# Patient Record
Sex: Female | Born: 1965
Health system: Southern US, Community
[De-identification: ages and names within clinical notes are randomized; demographics above are authoritative.]

## PROBLEM LIST (undated history)

## (undated) DIAGNOSIS — D649 Anemia, unspecified: Secondary | ICD-10-CM

## (undated) DIAGNOSIS — Z8709 Personal history of other diseases of the respiratory system: Secondary | ICD-10-CM

## (undated) DIAGNOSIS — N920 Excessive and frequent menstruation with regular cycle: Secondary | ICD-10-CM

## (undated) DIAGNOSIS — Z87440 Personal history of urinary (tract) infections: Secondary | ICD-10-CM

## (undated) HISTORY — DX: Personal history of other diseases of the respiratory system: Z87.09

## (undated) HISTORY — PX: LUMBAR LAMINECTOMY: SHX95

## (undated) HISTORY — DX: Anemia, unspecified: D64.9

## (undated) HISTORY — DX: Excessive and frequent menstruation with regular cycle: N92.0

## (undated) HISTORY — DX: Personal history of urinary (tract) infections: Z87.440

---

## 2000-09-08 ENCOUNTER — Other Ambulatory Visit: Admission: RE | Admit: 2000-09-08 | Discharge: 2000-09-08 | Payer: Self-pay | Admitting: Gynecology

## 2003-06-14 ENCOUNTER — Encounter: Payer: Self-pay | Admitting: Gynecology

## 2003-06-14 ENCOUNTER — Encounter: Admission: RE | Admit: 2003-06-14 | Discharge: 2003-06-14 | Payer: Self-pay | Admitting: Gynecology

## 2005-02-05 ENCOUNTER — Ambulatory Visit: Payer: Self-pay | Admitting: Family Medicine

## 2005-03-25 ENCOUNTER — Ambulatory Visit: Payer: Self-pay | Admitting: Family Medicine

## 2005-04-09 ENCOUNTER — Ambulatory Visit: Payer: Self-pay | Admitting: Family Medicine

## 2005-04-09 ENCOUNTER — Other Ambulatory Visit: Admission: RE | Admit: 2005-04-09 | Discharge: 2005-04-09 | Payer: Self-pay | Admitting: Family Medicine

## 2005-06-13 ENCOUNTER — Encounter: Admission: RE | Admit: 2005-06-13 | Discharge: 2005-06-13 | Payer: Self-pay | Admitting: Family Medicine

## 2005-06-28 ENCOUNTER — Ambulatory Visit: Payer: Self-pay | Admitting: Family Medicine

## 2006-01-22 ENCOUNTER — Ambulatory Visit: Payer: Self-pay | Admitting: Family Medicine

## 2006-07-25 ENCOUNTER — Encounter: Admission: RE | Admit: 2006-07-25 | Discharge: 2006-07-25 | Payer: Self-pay | Admitting: Gynecology

## 2006-07-29 ENCOUNTER — Ambulatory Visit: Payer: Self-pay | Admitting: Family Medicine

## 2007-07-20 ENCOUNTER — Telehealth (INDEPENDENT_AMBULATORY_CARE_PROVIDER_SITE_OTHER): Payer: Self-pay | Admitting: *Deleted

## 2007-07-21 ENCOUNTER — Ambulatory Visit: Payer: Self-pay | Admitting: Family Medicine

## 2007-07-21 DIAGNOSIS — H40009 Preglaucoma, unspecified, unspecified eye: Secondary | ICD-10-CM | POA: Insufficient documentation

## 2007-07-21 DIAGNOSIS — K2970 Gastritis, unspecified, without bleeding: Secondary | ICD-10-CM | POA: Insufficient documentation

## 2007-07-21 DIAGNOSIS — J309 Allergic rhinitis, unspecified: Secondary | ICD-10-CM | POA: Insufficient documentation

## 2007-07-21 DIAGNOSIS — K2990 Gastroduodenitis, unspecified, without bleeding: Secondary | ICD-10-CM

## 2007-07-31 ENCOUNTER — Encounter: Admission: RE | Admit: 2007-07-31 | Discharge: 2007-07-31 | Payer: Self-pay | Admitting: Gynecology

## 2007-07-31 ENCOUNTER — Encounter: Payer: Self-pay | Admitting: Family Medicine

## 2007-08-07 ENCOUNTER — Encounter: Payer: Self-pay | Admitting: Family Medicine

## 2007-08-07 ENCOUNTER — Encounter: Admission: RE | Admit: 2007-08-07 | Discharge: 2007-08-07 | Payer: Self-pay | Admitting: Gynecology

## 2007-10-05 ENCOUNTER — Ambulatory Visit: Payer: Self-pay | Admitting: Family Medicine

## 2007-10-05 DIAGNOSIS — R55 Syncope and collapse: Secondary | ICD-10-CM | POA: Insufficient documentation

## 2007-10-05 DIAGNOSIS — R109 Unspecified abdominal pain: Secondary | ICD-10-CM | POA: Insufficient documentation

## 2007-10-05 LAB — CONVERTED CEMR LAB
Bacteria, UA: 0
Bilirubin Urine: NEGATIVE
Casts: 0 /lpf
Protein, U semiquant: NEGATIVE
Urine crystals, microscopic: 1 /hpf

## 2008-01-13 ENCOUNTER — Encounter (INDEPENDENT_AMBULATORY_CARE_PROVIDER_SITE_OTHER): Payer: Self-pay | Admitting: *Deleted

## 2008-02-12 ENCOUNTER — Encounter: Admission: RE | Admit: 2008-02-12 | Discharge: 2008-02-12 | Payer: Self-pay | Admitting: Gynecology

## 2008-04-06 ENCOUNTER — Telehealth: Payer: Self-pay | Admitting: Family Medicine

## 2008-08-05 ENCOUNTER — Encounter: Admission: RE | Admit: 2008-08-05 | Discharge: 2008-08-05 | Payer: Self-pay | Admitting: Gynecology

## 2009-01-25 ENCOUNTER — Telehealth (INDEPENDENT_AMBULATORY_CARE_PROVIDER_SITE_OTHER): Payer: Self-pay | Admitting: *Deleted

## 2009-08-08 ENCOUNTER — Ambulatory Visit: Payer: Self-pay | Admitting: Family Medicine

## 2009-08-08 DIAGNOSIS — N390 Urinary tract infection, site not specified: Secondary | ICD-10-CM | POA: Insufficient documentation

## 2009-08-08 DIAGNOSIS — R233 Spontaneous ecchymoses: Secondary | ICD-10-CM | POA: Insufficient documentation

## 2009-08-08 DIAGNOSIS — D509 Iron deficiency anemia, unspecified: Secondary | ICD-10-CM | POA: Insufficient documentation

## 2009-08-08 LAB — CONVERTED CEMR LAB
Nitrite: POSITIVE
Urobilinogen, UA: 0.2

## 2009-08-09 LAB — CONVERTED CEMR LAB
Basophils Relative: 0.5 % (ref 0.0–3.0)
Eosinophils Absolute: 0.1 10*3/uL (ref 0.0–0.7)
INR: 1.1 — ABNORMAL HIGH (ref 0.8–1.0)
MCHC: 33.5 g/dL (ref 30.0–36.0)
MCV: 92.3 fL (ref 78.0–100.0)
Monocytes Absolute: 0.5 10*3/uL (ref 0.1–1.0)
Neutrophils Relative %: 64.8 % (ref 43.0–77.0)
RBC: 4.07 M/uL (ref 3.87–5.11)
RDW: 12.3 % (ref 11.5–14.6)

## 2009-08-11 ENCOUNTER — Encounter: Admission: RE | Admit: 2009-08-11 | Discharge: 2009-08-11 | Payer: Self-pay | Admitting: Gynecology

## 2010-08-13 ENCOUNTER — Encounter: Admission: RE | Admit: 2010-08-13 | Discharge: 2010-08-13 | Payer: Self-pay | Admitting: Gynecology

## 2010-11-11 ENCOUNTER — Encounter: Payer: Self-pay | Admitting: Family Medicine

## 2011-07-08 ENCOUNTER — Other Ambulatory Visit: Payer: Self-pay | Admitting: Obstetrics and Gynecology

## 2011-07-08 DIAGNOSIS — Z1231 Encounter for screening mammogram for malignant neoplasm of breast: Secondary | ICD-10-CM

## 2011-08-16 ENCOUNTER — Ambulatory Visit
Admission: RE | Admit: 2011-08-16 | Discharge: 2011-08-16 | Disposition: A | Discharge status: Home / Self Care | Payer: Self-pay | Source: Ambulatory Visit | Attending: Obstetrics and Gynecology | Admitting: Obstetrics and Gynecology

## 2011-08-16 DIAGNOSIS — Z1231 Encounter for screening mammogram for malignant neoplasm of breast: Secondary | ICD-10-CM

## 2012-07-07 ENCOUNTER — Other Ambulatory Visit: Payer: Self-pay | Admitting: Obstetrics and Gynecology

## 2012-07-07 DIAGNOSIS — Z1231 Encounter for screening mammogram for malignant neoplasm of breast: Secondary | ICD-10-CM

## 2012-07-31 ENCOUNTER — Encounter: Payer: Self-pay | Admitting: Family Medicine

## 2012-07-31 ENCOUNTER — Ambulatory Visit (INDEPENDENT_AMBULATORY_CARE_PROVIDER_SITE_OTHER): Payer: BC Managed Care – PPO | Admitting: Family Medicine

## 2012-07-31 VITALS — BP 124/72 | HR 68 | Temp 98.4°F | Ht 68.0 in | Wt 138.5 lb

## 2012-07-31 DIAGNOSIS — G43009 Migraine without aura, not intractable, without status migrainosus: Secondary | ICD-10-CM | POA: Insufficient documentation

## 2012-07-31 DIAGNOSIS — J358 Other chronic diseases of tonsils and adenoids: Secondary | ICD-10-CM | POA: Insufficient documentation

## 2012-07-31 DIAGNOSIS — J069 Acute upper respiratory infection, unspecified: Secondary | ICD-10-CM | POA: Insufficient documentation

## 2012-07-31 NOTE — Assessment & Plan Note (Signed)
Disc salt water gargles and use of long q tip  Nl exam today  Will update

## 2012-07-31 NOTE — Patient Instructions (Addendum)
You can gargle and try to remove tonsillar debris with a q tip  For cold symtoms - zyrtec, fluids/mucinex as needed For migraine - try aleve 1-2 pills with food twice daily  Drink enough water Gradually cut caff to none  Keep same sleep and wake times  Follow up if no improvement

## 2012-07-31 NOTE — Assessment & Plan Note (Signed)
Multifactorial in terms of triggers Long discussion about migraine and lifesyle change- rec made- see AVS nsaids- longer acting if needed  Disc poss of rebound F/u if not imp-may need prophylaxis

## 2012-07-31 NOTE — Progress Notes (Signed)
Subjective:    Patient ID: Alice Page, female    DOB: 1966/01/21, 46 y.o.   MRN: [id number]  HPI Here for throat - and headaches  Started achiness and st this week  Feels congestion  Coughs up a bit of yellow phlegm No fever today  Also some debris in throat- with odor  Can see white thing in her throat   No severe throat pain or fever - just aches  No meds except advil  HEADACHE Had them all her life-they are increasing Usually excedrin will take them away  Will get them for 3-4 days  One sided, throbbing and worse with exertion  Mild light / sound sens Sometimes nauseated  Now gets headaches more (used to be around menses), now every 1-2 weeks  1 cup of coffee am and afternoon  No regular exercise  Sleeps well   Patient Active Problem List  Diagnosis  . ANEMIA, IRON DEFICIENCY  . INCREASED INTRAOCULAR PRESSURE  . ALLERGIC RHINITIS  . GASTRITIS  . UTI  . SYNCOPE  . ECCHYMOSES, SPONTANEOUS  . FLANK PAIN, RIGHT   No past medical history on file. No past surgical history on file. History  Substance Use Topics  . Smoking status: Never Smoker   . Smokeless tobacco: Not on file  . Alcohol Use: No   No family history on file. Allergies  Allergen Reactions  . Cetirizine Hcl     REACTION: sedation  . Ciprofloxacin     REACTION: joint pain   No current outpatient prescriptions on file prior to visit.       Review of Systems Review of Systems  Constitutional: Negative for fever, appetite change, fatigue and unexpected weight change.  ENT pos for scratchy throat/ congestion/ post nasal drip  Eyes: Negative for pain and visual disturbance.  Respiratory: Negative for sob or wheeze Cardiovascular: Negative for cp or palpitations    Gastrointestinal: Negative for nausea, diarrhea and constipation.  Genitourinary: Negative for urgency and frequency.  Skin: Negative for pallor or rash   Neurological: Negative for weakness, light-headedness, numbness and  headaches.  Hematological: Negative for adenopathy. Does not bruise/bleed easily.  Psychiatric/Behavioral: Negative for dysphoric mood. The patient is not nervous/anxious.         Objective:   Physical Exam  Constitutional: She appears well-developed and well-nourished. No distress.  HENT:  Head: Normocephalic and atraumatic.  Right Ear: External ear normal.  Left Ear: External ear normal.  Mouth/Throat: Oropharynx is clear and moist.       Nares are injected and congested   Throat- with post nasal drip / scant tonsillar debris -no redness or lesions No facial tenderness  Eyes: Conjunctivae normal and EOM are normal. Pupils are equal, round, and reactive to light. Right eye exhibits no discharge. Left eye exhibits no discharge.  Neck: Normal range of motion. Neck supple. No JVD present. No thyromegaly present.  Cardiovascular: Normal rate and regular rhythm.   Pulmonary/Chest: Effort normal and breath sounds normal. No respiratory distress. She has no wheezes.  Abdominal: Soft. Bowel sounds are normal. She exhibits no distension and no mass. There is no tenderness.  Musculoskeletal: She exhibits no edema.  Lymphadenopathy:    She has no cervical adenopathy.  Neurological: She is alert. She has normal reflexes. She displays no atrophy and no tremor. No cranial nerve deficit or sensory deficit. She exhibits normal muscle tone. Coordination and gait normal.       No focal cerebellar signs   Skin: Skin is warm  and dry. No rash noted. No erythema. No pallor.  Psychiatric: She has a normal mood and affect.          Assessment & Plan:

## 2012-07-31 NOTE — Assessment & Plan Note (Signed)
Viral, uncomplicated and reasuring exam Disc symptomatic care - see instructions on AVS  Update if not starting to improve in a week or if worsening

## 2012-08-17 ENCOUNTER — Ambulatory Visit
Admission: RE | Admit: 2012-08-17 | Discharge: 2012-08-17 | Disposition: A | Discharge status: Home / Self Care | Payer: BC Managed Care – PPO | Source: Ambulatory Visit | Attending: Obstetrics and Gynecology | Admitting: Obstetrics and Gynecology

## 2012-08-17 DIAGNOSIS — Z1231 Encounter for screening mammogram for malignant neoplasm of breast: Secondary | ICD-10-CM

## 2013-07-14 ENCOUNTER — Other Ambulatory Visit: Payer: Self-pay

## 2013-07-14 DIAGNOSIS — Z1231 Encounter for screening mammogram for malignant neoplasm of breast: Secondary | ICD-10-CM

## 2013-08-19 ENCOUNTER — Ambulatory Visit: Payer: BC Managed Care – PPO

## 2013-09-07 ENCOUNTER — Encounter: Payer: Self-pay | Admitting: Family Medicine

## 2013-09-07 ENCOUNTER — Ambulatory Visit (INDEPENDENT_AMBULATORY_CARE_PROVIDER_SITE_OTHER): Payer: BC Managed Care – PPO | Admitting: Family Medicine

## 2013-09-07 VITALS — BP 120/88 | HR 68 | Temp 97.6°F | Ht 68.0 in | Wt 141.0 lb

## 2013-09-07 DIAGNOSIS — R300 Dysuria: Secondary | ICD-10-CM

## 2013-09-07 DIAGNOSIS — N390 Urinary tract infection, site not specified: Secondary | ICD-10-CM

## 2013-09-07 LAB — POCT UA - MICROSCOPIC ONLY

## 2013-09-07 LAB — POCT URINALYSIS DIPSTICK
Bilirubin, UA: NEGATIVE
Glucose, UA: NEGATIVE
Nitrite, UA: POSITIVE
Spec Grav, UA: 1.01
Urobilinogen, UA: 0.2

## 2013-09-07 MED ORDER — NITROFURANTOIN MONOHYD MACRO 100 MG PO CAPS: 100.0000 mg | ORAL_CAPSULE | Freq: Two times a day (BID) | ORAL | Status: DC

## 2013-09-07 NOTE — Addendum Note (Signed)
Addended by: Damita Lack on: 09/07/2013 03:51 PM   Modules accepted: Orders

## 2013-09-07 NOTE — Patient Instructions (Signed)
Start and complete antibiotics. Push fluids. Given hematuria.. Return in 2 weeks for UA only to make sure blood is gone completely.

## 2013-09-07 NOTE — Progress Notes (Signed)
  Subjective:    Patient ID: Alice Page, female    DOB: 1966-01-07, 47 y.o.   MRN: [id number]  Urinary Tract Infection  This is a new problem. The current episode started yesterday. The problem has been gradually worsening. The quality of the pain is described as burning. The pain is moderate. There has been no fever. She is sexually active. There is no history of pyelonephritis. Associated symptoms include frequency and hematuria. Pertinent negatives include no chills, flank pain, hesitancy, nausea, urgency or vomiting. She has tried increased fluids for the symptoms. The treatment provided no relief. Her past medical history is significant for recurrent UTIs. There is no history of catheterization, kidney stones, a single kidney, urinary stasis or a urological procedure. Hx of UTI      Review of Systems  Constitutional: Negative for chills.  Gastrointestinal: Negative for nausea and vomiting.  Genitourinary: Positive for frequency and hematuria. Negative for hesitancy, urgency and flank pain.       Objective:   Physical Exam  Constitutional: Vital signs are normal. She appears well-developed and well-nourished. She is cooperative.  Non-toxic appearance. She does not appear ill. No distress.  HENT:  Head: Normocephalic.  Right Ear: Hearing, tympanic membrane, external ear and ear canal normal. Tympanic membrane is not erythematous, not retracted and not bulging.  Left Ear: Hearing, tympanic membrane, external ear and ear canal normal. Tympanic membrane is not erythematous, not retracted and not bulging.  Nose: No mucosal edema or rhinorrhea. Right sinus exhibits no maxillary sinus tenderness and no frontal sinus tenderness. Left sinus exhibits no maxillary sinus tenderness and no frontal sinus tenderness.  Mouth/Throat: Uvula is midline, oropharynx is clear and moist and mucous membranes are normal.  Eyes: Conjunctivae, EOM and lids are normal. Pupils are equal, round, and reactive to  light. Lids are everted and swept, no foreign bodies found.  Neck: Trachea normal and normal range of motion. Neck supple. Carotid bruit is not present. No mass and no thyromegaly present.  Cardiovascular: Normal rate, regular rhythm, S1 normal, S2 normal, normal heart sounds, intact distal pulses and normal pulses.  Exam reveals no gallop and no friction rub.   No murmur heard. Pulmonary/Chest: Effort normal and breath sounds normal. Not tachypneic. No respiratory distress. She has no decreased breath sounds. She has no wheezes. She has no rhonchi. She has no rales.  Abdominal: Soft. Normal appearance and bowel sounds are normal. There is no tenderness. There is no CVA tenderness.  Neurological: She is alert.  Skin: Skin is warm, dry and intact. No rash noted.  Psychiatric: Her speech is normal and behavior is normal. Judgment and thought content normal. Her mood appears not anxious. Cognition and memory are normal. She does not exhibit a depressed mood.          Assessment & Plan:

## 2013-09-07 NOTE — Assessment & Plan Note (Signed)
Simple uncomplicated.. macrobid BID x 7 days.

## 2013-09-07 NOTE — Progress Notes (Signed)
Pre-visit discussion using our clinic review tool. No additional management support is needed unless otherwise documented below in the visit note.  

## 2013-09-10 ENCOUNTER — Ambulatory Visit
Admission: RE | Admit: 2013-09-10 | Discharge: 2013-09-10 | Disposition: A | Discharge status: Home / Self Care | Payer: BC Managed Care – PPO | Source: Ambulatory Visit

## 2013-09-10 DIAGNOSIS — Z1231 Encounter for screening mammogram for malignant neoplasm of breast: Secondary | ICD-10-CM

## 2013-09-14 ENCOUNTER — Telehealth: Payer: Self-pay | Admitting: Family Medicine

## 2013-09-14 NOTE — Telephone Encounter (Signed)
Patient Information:  [name] Name: [name]  Phone: [phone]  Patient: Alice Page, Alice Page  Gender: Female  DOB: Jul 01, 1966  Age: 47 Years  PCP: Roxy Manns [facility])  Pregnant: No  Office Follow Up:  Does the office need to follow up with this patient?: Yes  Instructions For The Office: Request different antibiotic. Macrobid causing side effects  RN Note:  Pharmacy [address]  patient would like different antibiotic due to Macrobid side effects.  Please advise.  Symptoms  Reason For Call & Symptoms: Patient was seen in the office last week for UTI and saw Kerby Nora MD.  Placed on Macrobid .  She reports the Macrobid is making her feel bad- headaches, nausea and generally sick.'.  She stopped the medication Saturday and Sunday and felt better.  When she restarted the medication symptoms returned. The UTI is better.     Would like a different Antibiotic  Reviewed Health History In EMR: Yes  Reviewed Medications In EMR: Yes  Reviewed Allergies In EMR: Yes  Reviewed Surgeries / Procedures: Yes  Date of Onset of Symptoms: 09/07/2013  Treatments Tried: Macrobid  Treatments Tried Worked: Yes OB / GYN:  LMP: 08/24/2013  Guideline(s) Used:  Urination Pain - Female  No Protocol Available - Sick Adult  Disposition Per Guideline:   Discuss with PCP and Callback by Nurse Today  Reason For Disposition Reached:   Nursing judgment  Advice Given:  Call Back If:  New symptoms develop  You become worse.  RN Overrode Recommendation:  Patient Requests Prescription  Request different antibiotic. Macrobid causing side effects

## 2013-09-14 NOTE — Telephone Encounter (Signed)
Left message for patient to return my call.

## 2013-09-14 NOTE — Telephone Encounter (Signed)
Given she took for 4-5 days and symptoms have resolved as well as she has multiple antibiotic intolerances, I recommend staying off antibiotics for now. Call if symptoms returning.

## 2013-09-15 NOTE — Telephone Encounter (Signed)
Patient notified as instructed by telephone.  Alice Page states she took it yesterday with some food and milk and seemed to tolerate it much better. I advised that if she feels she can continue and finish the full course of antibiotics, to lets try but if they start making her sick again, she can stop and just call us if her symptoms return.  Patient in agreement with plan.

## 2014-01-26 ENCOUNTER — Telehealth: Payer: Self-pay | Admitting: Family Medicine

## 2014-01-26 NOTE — Telephone Encounter (Signed)
Patient Information:  [name] Name: Burnett HarryShelly  Phone: [phone]  Patient: Alice Page, Alice Page  Gender: Female  DOB: 05-16-1966  Age: 48 Years  [facility]  Pregnant: No  Office Follow Up:  Does the office need to follow up with this patient?: No  Instructions For The Office: N/A   Symptoms  Reason For Call & Symptoms: Started with burning with urination yesterday morning-01/25/14 and sx got worse by 0100-01/26/14. Having urgency, burning, chills, pink colored urine and trace blood with wiping. Increased fluids overnight and started AZO and old Rx -Nitrofurantoin Macro 100 mgs 1 tab at 0130. Last UTI was in 08/2013 and has 5 pills left. Afebrile this morning at 0900 and sx starting to improve. Requesting appointment for 01/27/14.   Reviewed Health History In EMR: Yes  Reviewed Medications In EMR: Yes  Reviewed Allergies In EMR: Yes  Reviewed Surgeries / Procedures: Yes  Date of Onset of Symptoms: 01/25/2014  Treatments Tried: Azo tabs, Nitrofurantoin  Treatments Tried Worked: No OB / GYN:  LMP: 01/12/2014  Guideline(s) Used:  Urination Pain - Female  Disposition Per Guideline:   See Today in Office  Reason For Disposition Reached:   Painful urination AND EITHER frequency or urgency  Advice Given:  Fluids:   Drink extra fluids. Drink 8-10 glasses of liquids a day (Reason: to produce a dilute, non-irritating urine).  Warm Saline SITZ Baths to Reduce Pain:  Sit in a warm saline bath for 20 minutes to cleanse the area and to reduce pain. Add 2 oz. of table salt or baking soda to a tub of water.  Call Back If:  You become worse.  Patient Will Follow Care Advice:Yes  Appointment Scheduled:  01/27/2014 10:30:00 Appointment Scheduled Provider:  Nicki ReaperBaity, [place]

## 2014-01-27 ENCOUNTER — Encounter: Payer: Self-pay | Admitting: Internal Medicine

## 2014-01-27 ENCOUNTER — Ambulatory Visit (INDEPENDENT_AMBULATORY_CARE_PROVIDER_SITE_OTHER): Payer: BC Managed Care – PPO | Admitting: Internal Medicine

## 2014-01-27 VITALS — BP 110/78 | HR 59 | Temp 98.0°F | Wt 140.2 lb

## 2014-01-27 DIAGNOSIS — R3915 Urgency of urination: Secondary | ICD-10-CM

## 2014-01-27 DIAGNOSIS — R300 Dysuria: Secondary | ICD-10-CM

## 2014-01-27 DIAGNOSIS — IMO0001 Reserved for inherently not codable concepts without codable children: Secondary | ICD-10-CM

## 2014-01-27 DIAGNOSIS — N390 Urinary tract infection, site not specified: Secondary | ICD-10-CM

## 2014-01-27 DIAGNOSIS — R350 Frequency of micturition: Secondary | ICD-10-CM

## 2014-01-27 LAB — POCT URINALYSIS DIPSTICK
Bilirubin, UA: NEGATIVE
Glucose, UA: NEGATIVE
KETONES UA: NEGATIVE
Leukocytes, UA: NEGATIVE
Nitrite, UA: NEGATIVE
PH UA: 6.5
PROTEIN UA: NEGATIVE
SPEC GRAV UA: 1.01
Urobilinogen, UA: 0.2

## 2014-01-27 MED ORDER — NITROFURANTOIN MONOHYD MACRO 100 MG PO CAPS: 100.0000 mg | ORAL_CAPSULE | Freq: Two times a day (BID) | ORAL | Status: DC

## 2014-01-27 NOTE — Patient Instructions (Addendum)
Urinary Tract Infection  Urinary tract infections (UTIs) can develop anywhere along your urinary tract. Your urinary tract is your body's drainage system for removing wastes and extra water. Your urinary tract includes two kidneys, two ureters, a bladder, and a urethra. Your kidneys are a pair of bean-shaped organs. Each kidney is about the size of your fist. They are located below your ribs, one on each side of your spine.  CAUSES  Infections are caused by microbes, which are microscopic organisms, including fungi, viruses, and bacteria. These organisms are so small that they can only be seen through a microscope. Bacteria are the microbes that most commonly cause UTIs.  SYMPTOMS   Symptoms of UTIs may vary by age and gender of the patient and by the location of the infection. Symptoms in young women typically include a frequent and intense urge to urinate and a painful, burning feeling in the bladder or urethra during urination. Older women and men are more likely to be tired, shaky, and weak and have muscle aches and abdominal pain. A fever may mean the infection is in your kidneys. Other symptoms of a kidney infection include pain in your back or sides below the ribs, nausea, and vomiting.  DIAGNOSIS  To diagnose a UTI, your caregiver will ask you about your symptoms. Your caregiver also will ask to provide a urine sample. The urine sample will be tested for bacteria and white blood cells. White blood cells are made by your body to help fight infection.  TREATMENT   Typically, UTIs can be treated with medication. Because most UTIs are caused by a bacterial infection, they usually can be treated with the use of antibiotics. The choice of antibiotic and length of treatment depend on your symptoms and the type of bacteria causing your infection.  HOME CARE INSTRUCTIONS   If you were prescribed antibiotics, take them exactly as your caregiver instructs you. Finish the medication even if you feel better after you  have only taken some of the medication.   Drink enough water and fluids to keep your urine clear or pale yellow.   Avoid caffeine, tea, and carbonated beverages. They tend to irritate your bladder.   Empty your bladder often. Avoid holding urine for long periods of time.   Empty your bladder before and after sexual intercourse.   After a bowel movement, women should cleanse from front to back. Use each tissue only once.  SEEK MEDICAL CARE IF:    You have back pain.   You develop a fever.   Your symptoms do not begin to resolve within 3 days.  SEEK IMMEDIATE MEDICAL CARE IF:    You have severe back pain or lower abdominal pain.   You develop chills.   You have nausea or vomiting.   You have continued burning or discomfort with urination.  MAKE SURE YOU:    Understand these instructions.   Will watch your condition.   Will get help right away if you are not doing well or get worse.  Document Released: 07/17/2005 Document Revised: 04/07/2012 Document Reviewed: 11/15/2011  [employer] Patient Information 2014 [employer].

## 2014-01-27 NOTE — Progress Notes (Signed)
Pre visit review using our clinic review tool, if applicable. No additional management support is needed unless otherwise documented below in the visit note. 

## 2014-01-27 NOTE — Addendum Note (Signed)
Addended by: Roena MaladyEVONTENNO, Hollis Oh Y on: 01/27/2014 01:40 PM   Modules accepted: Orders

## 2014-01-27 NOTE — Progress Notes (Signed)
HPI  Pt presents to the clinic today with c/o urgency, frequency, dysuria and blood in her urine. She reports this started 2 days ago. She did have some left over macrobid. She has taken this for 2 days. She has also taken Azo with some relief. She denies fever, chills, low back pain, nausea or vomitng.   Review of Systems  Past Medical History  Diagnosis Date  . History of allergic rhinitis   . Heavy menses   . Anemia   . History of recurrent UTIs     No family history on file.  History   Social History  . Marital Status: Married    Spouse Name: N/A    Number of Children: N/A  . Years of Education: N/A   Occupational History  . Not on file.   Social History Main Topics  . Smoking status: Never Smoker   . Smokeless tobacco: Never Used  . Alcohol Use: No  . Drug Use: No  . Sexual Activity: Not on file   Other Topics Concern  . Not on file   Social History Narrative  . No narrative on file    Allergies  Allergen Reactions  . Cetirizine Hcl     REACTION: sedation  . Ciprofloxacin     REACTION: joint pain    Constitutional: Denies fever, malaise, fatigue, headache or abrupt weight changes.   GU: Pt reports urgency, frequency and pain with urination. Denies burning sensation, odor or discharge. Skin: Denies redness, rashes, lesions or ulcercations.   No other specific complaints in a complete review of systems (except as listed in HPI above).    Objective:   Physical Exam  BP 110/78  Pulse 59  Temp(Src) 98 F (36.7 C) (Oral)  Wt 140 lb 4 oz (63.617 kg)  SpO2 99% Wt Readings from Last 3 Encounters:  01/27/14 140 lb 4 oz (63.617 kg)  09/07/13 141 lb (63.957 kg)  07/31/12 138 lb 8 oz (62.823 kg)    General: Appears her stated age, well developed, well nourished in NAD. Cardiovascular: Normal rate and rhythm. S1,S2 noted.  No murmur, rubs or gallops noted. No JVD or BLE edema. No carotid bruits noted. Pulmonary/Chest: Normal effort and positive  vesicular breath sounds. No respiratory distress. No wheezes, rales or ronchi noted.  Abdomen: Soft and nontender. Normal bowel sounds, no bruits noted. No distention or masses noted. Liver, spleen and kidneys non palpable. Tender to palpation over the bladder area. No CVA tenderness.      Assessment & Plan:   Urgency, Frequency, Dysuria secondary to UTI:  Urinalysis: small blood, trace leuks Will send urine culture eRx sent if for Macrobid 100 mg BID x 5 days OK to take AZO OTC Drink plenty of fluids  RTC as needed or if symptoms persist.

## 2014-01-28 LAB — URINE CULTURE
Colony Count: NO GROWTH
Organism ID, Bacteria: NO GROWTH

## 2014-03-21 ENCOUNTER — Ambulatory Visit (INDEPENDENT_AMBULATORY_CARE_PROVIDER_SITE_OTHER): Payer: BC Managed Care – PPO | Admitting: Family Medicine

## 2014-03-21 ENCOUNTER — Encounter: Payer: Self-pay | Admitting: Family Medicine

## 2014-03-21 VITALS — BP 124/82 | HR 65 | Temp 97.8°F | Ht 68.0 in | Wt 140.5 lb

## 2014-03-21 DIAGNOSIS — R1013 Epigastric pain: Secondary | ICD-10-CM

## 2014-03-21 DIAGNOSIS — R319 Hematuria, unspecified: Secondary | ICD-10-CM

## 2014-03-21 DIAGNOSIS — R079 Chest pain, unspecified: Secondary | ICD-10-CM

## 2014-03-21 DIAGNOSIS — R3129 Other microscopic hematuria: Secondary | ICD-10-CM

## 2014-03-21 LAB — POCT URINALYSIS DIPSTICK
Bilirubin, UA: NEGATIVE
GLUCOSE UA: NEGATIVE
KETONES UA: NEGATIVE
Leukocytes, UA: NEGATIVE
Nitrite, UA: NEGATIVE
Protein, UA: NEGATIVE
Urobilinogen, UA: 0.2
pH, UA: 7

## 2014-03-21 LAB — POCT UA - MICROSCOPIC ONLY
Bacteria, U Microscopic: 0
Casts, Ur, LPF, POC: 0
WBC, UR, HPF, POC: 0
Yeast, UA: 0

## 2014-03-21 MED ORDER — OMEPRAZOLE 20 MG PO CPDR: 20.0000 mg | DELAYED_RELEASE_CAPSULE | Freq: Every day | ORAL | Status: DC

## 2014-03-21 NOTE — Progress Notes (Signed)
Subjective:    Patient ID: Alice Page, female    DOB: [birth year], 48 y.o.   MRN: [id number]  HPI Here for re check ua and also pain (shoulder/back/abd)  Last night getting ready for bed - she had pain in epigastric area that radiated to her R shoulder and chest  Somewhat sharp/ tingly -not severe but bothersome  It kept her up a bit last night  Has been cleaning lately No nausea or bloating/ and no constipation or diarrhea   Did take 2 excedrin for a HA last night for a headache   Still has a gallbladder   EKG - had nonspc T wave abn and RSR (V1)  ua Results for orders placed in visit on 03/21/14  POCT URINALYSIS DIPSTICK      Result Value Ref Range   Color, UA yellow     Clarity, UA hazy     Glucose, UA neg.     Bilirubin, UA neg.     Ketones, UA neg.     Spec Grav, UA <=1.005     Blood, UA moderate     pH, UA 7.0     Protein, UA neg.     Urobilinogen, UA 0.2     Nitrite, UA neg.     Leukocytes, UA Negative     not on period  Microscopic exam is totally clear today-no rbc at all No dysuria  At times she has bloating - and it is hard to urinate (a lot of gas)   Not a smoker   Patient Active Problem List   Diagnosis Date Noted  . Abdominal pain, epigastric 03/21/2014  . Microscopic hematuria 03/21/2014  . Tonsillar debris 07/31/2012  . Migraine without aura, not refractory 07/31/2012  . ANEMIA, IRON DEFICIENCY 08/08/2009  . ECCHYMOSES, SPONTANEOUS 08/08/2009  . SYNCOPE 10/05/2007  . FLANK PAIN, RIGHT 10/05/2007  . INCREASED INTRAOCULAR PRESSURE 07/21/2007  . ALLERGIC RHINITIS 07/21/2007   Past Medical History  Diagnosis Date  . History of allergic rhinitis   . Heavy menses   . Anemia   . History of recurrent UTIs    No past surgical history on file. History  Substance Use Topics  . Smoking status: Never Smoker   . Smokeless tobacco: Never Used  . Alcohol Use: No   No family history on file. Allergies  Allergen Reactions  . Cetirizine Hcl     REACTION: sedation  . Ciprofloxacin     REACTION: joint pain   Current Outpatient Prescriptions on File Prior to Visit  Medication Sig Dispense Refill  . Aspirin-Acetaminophen-Caffeine (EXCEDRIN PO) Take by mouth as needed.      . ferrous sulfate 325 (65 FE) MG tablet Take 325 mg by mouth daily with breakfast.      . vitamin C (ASCORBIC ACID) 500 MG tablet Take 500 mg by mouth daily.       No current facility-administered medications on file prior to visit.    Review of Systems Review of Systems  Constitutional: Negative for fever, appetite change, fatigue and unexpected weight change.  Eyes: Negative for pain and visual disturbance.  Respiratory: Negative for cough and shortness of breath.   Cardiovascular: Negative for  palpitations    Gastrointestinal: Negative for nausea, diarrhea and constipation. pos for upper abd pain that rad to R shoulder  Genitourinary: Negative for urgency and frequency. neg for gross hematuria  Skin: Negative for pallor or rash   Neurological: Negative for weakness, light-headedness, numbness and pos for headaches.  Hematological: Negative for adenopathy. Does not bruise/bleed easily.  Psychiatric/Behavioral: Negative for dysphoric mood. The patient is not nervous/anxious.         Objective:   Physical Exam  Constitutional: She appears well-developed and well-nourished. No distress.  HENT:  Head: Normocephalic and atraumatic.  Mouth/Throat: Oropharynx is clear and moist.  Eyes: Conjunctivae and EOM are normal. Pupils are equal, round, and reactive to light. No scleral icterus.  Neck: Normal range of motion. Neck supple. Carotid bruit is not present. No thyromegaly present.  Cardiovascular: Normal rate, regular rhythm, normal heart sounds and intact distal pulses.  Exam reveals no gallop.   No murmur heard. Pulmonary/Chest: Effort normal and breath sounds normal. No respiratory distress. She has no wheezes. She has no rales.  Abdominal: Soft. Bowel  sounds are normal. She exhibits no distension and no mass. There is no hepatosplenomegaly. There is tenderness in the epigastric area. There is no rebound, no guarding, no CVA tenderness, no tenderness at McBurney's point and negative Murphy's sign.  Musculoskeletal: She exhibits no edema.  Lymphadenopathy:    She has no cervical adenopathy.  Neurological: She is alert.  No cva tenderness   Skin: Skin is warm and dry. No rash noted. No erythema. No pallor.  Psychiatric: She has a normal mood and affect.          Assessment & Plan:

## 2014-03-21 NOTE — Assessment & Plan Note (Signed)
With some radiation to R shoulder blade Diff incl gastritis (nsaid) or gallbladder pathology  Trial of PPI- omeprazole and update  If not imp will ref for abd [place]

## 2014-03-21 NOTE — Progress Notes (Signed)
Pre visit review using our clinic review tool, if applicable. No additional management support is needed unless otherwise documented below in the visit note. 

## 2014-03-21 NOTE — Assessment & Plan Note (Signed)
This persists despite resolution of uti  Today- urine is dilute / cannot see rbc but dip test is mod for blood She does occ have bladder discomfort Ref to urology

## 2014-03-21 NOTE — Patient Instructions (Addendum)
Take the omeprazole once daily in am for 2-4 weeks  Also -if you take excedrin - take it with a full meal and take it only when you really need it  If your symptoms do not improve in the next week- call and let me know so we can set up an ultrasound  Stop at check out for a urology referral  If symptoms worsen at all please let me know

## 2014-04-11 ENCOUNTER — Telehealth: Payer: Self-pay | Admitting: Family Medicine

## 2014-04-11 NOTE — Telephone Encounter (Signed)
Patient Information:  [name] Name: Burnett HarryShelly  Phone: [phone]  Patient: Alice Page, Alice Page  Gender: Female  DOB: 1966-06-05  Age: 48 Years  [facility]  Pregnant: No  Office Follow Up:  Does the office need to follow up with this patient?: No  Instructions For The Office: N/A  RN Note:  Advised to keep area clean, apply antibiotic ointment and call back if any sxs of infection.  Symptoms  Reason For Call & Symptoms: Calling about a dime sized red spot on the back of right upper arm. Was working is her garden and noticed blood running down the back of her upper arm, spot on back of arm was purlple and now it is red, but no pain. Is hard for her to see since it is on the back of her arm but doesn't look like a bug bite or a cut. Cleaned area with water and bleeding stopped.  Reviewed Health History In EMR: Yes  Reviewed Medications In EMR: Yes  Reviewed Allergies In EMR: Yes  Reviewed Surgeries / Procedures: Yes  Date of Onset of Symptoms: 04/11/2014 OB / GYN:  LMP: 04/03/2014  Guideline(s) Used:  No Protocol Available - Sick Adult  Disposition Per Guideline:   Home Care  Reason For Disposition Reached:   Patient's symptoms are safe to treat at home per nursing judgment  Advice Given:  Call Back If:  New symptoms develop  You become worse.  Patient Will Follow Care Advice:  YES

## 2014-05-02 ENCOUNTER — Ambulatory Visit: Payer: BC Managed Care – PPO | Admitting: Family Medicine

## 2014-05-09 ENCOUNTER — Ambulatory Visit: Payer: BC Managed Care – PPO | Admitting: Family Medicine

## 2014-05-16 ENCOUNTER — Ambulatory Visit (INDEPENDENT_AMBULATORY_CARE_PROVIDER_SITE_OTHER): Payer: BC Managed Care – PPO | Admitting: Family Medicine

## 2014-05-16 ENCOUNTER — Encounter: Payer: Self-pay | Admitting: Family Medicine

## 2014-05-16 VITALS — BP 106/74 | HR 70 | Temp 98.2°F | Ht 68.0 in | Wt 143.0 lb

## 2014-05-16 DIAGNOSIS — M2550 Pain in unspecified joint: Secondary | ICD-10-CM | POA: Insufficient documentation

## 2014-05-16 DIAGNOSIS — R5383 Other fatigue: Secondary | ICD-10-CM

## 2014-05-16 DIAGNOSIS — N951 Menopausal and female climacteric states: Secondary | ICD-10-CM

## 2014-05-16 DIAGNOSIS — L659 Nonscarring hair loss, unspecified: Secondary | ICD-10-CM

## 2014-05-16 DIAGNOSIS — R5381 Other malaise: Secondary | ICD-10-CM

## 2014-05-16 LAB — COMPREHENSIVE METABOLIC PANEL
ALBUMIN: 4.1 g/dL (ref 3.5–5.2)
ALT: 12 U/L (ref 0–35)
AST: 13 U/L (ref 0–37)
Alkaline Phosphatase: 29 U/L — ABNORMAL LOW (ref 39–117)
BUN: 13 mg/dL (ref 6–23)
CO2: 28 mEq/L (ref 19–32)
Calcium: 9.4 mg/dL (ref 8.4–10.5)
Chloride: 107 mEq/L (ref 96–112)
Creatinine, Ser: 0.7 mg/dL (ref 0.4–1.2)
GFR: 94.55 mL/min (ref >60.00)
GLUCOSE: 109 mg/dL — AB (ref 70–99)
POTASSIUM: 4.1 meq/L (ref 3.5–5.1)
Sodium: 140 mEq/L (ref 135–145)
Total Bilirubin: 0.6 mg/dL (ref 0.2–1.2)
Total Protein: 7.2 g/dL (ref 6.0–8.3)

## 2014-05-16 LAB — CBC WITH DIFFERENTIAL/PLATELET
BASOS PCT: 0.4 % (ref 0.0–3.0)
Basophils Absolute: 0 10*3/uL (ref 0.0–0.1)
EOS PCT: 2.2 % (ref 0.0–5.0)
Eosinophils Absolute: 0.1 10*3/uL (ref 0.0–0.7)
HCT: 35.7 % — ABNORMAL LOW (ref 36.0–46.0)
HEMOGLOBIN: 11.8 g/dL — AB (ref 12.0–15.0)
LYMPHS ABS: 1.6 10*3/uL (ref 0.7–4.0)
Lymphocytes Relative: 26.9 % (ref 12.0–46.0)
MCHC: 33.1 g/dL (ref 30.0–36.0)
MCV: 90 fl (ref 78.0–100.0)
Monocytes Absolute: 0.5 10*3/uL (ref 0.1–1.0)
Monocytes Relative: 7.7 % (ref 3.0–12.0)
NEUTROS PCT: 62.8 % (ref 43.0–77.0)
Neutro Abs: 3.8 10*3/uL (ref 1.4–7.7)
Platelets: 214 10*3/uL (ref 150.0–400.0)
RBC: 3.97 Mil/uL (ref 3.87–5.11)
RDW: 14.5 % (ref 11.5–15.5)
WBC: 6 10*3/uL (ref 4.0–10.5)

## 2014-05-16 LAB — TSH: TSH: 1.5 u[IU]/mL (ref 0.35–4.50)

## 2014-05-16 LAB — VITAMIN D 25 HYDROXY (VIT D DEFICIENCY, FRACTURES): VITD: 31.58 ng/mL (ref 30.00–100.00)

## 2014-05-16 LAB — VITAMIN B12: VITAMIN B 12: 242 pg/mL (ref 211–911)

## 2014-05-16 NOTE — Patient Instructions (Signed)
Labs today  Many of your symptoms are consistent with perimenopause and hormone change  Take care of yourself - get good nutrition and exercise and sleep

## 2014-05-16 NOTE — Progress Notes (Signed)
Pre visit review using our clinic review tool, if applicable. No additional management support is needed unless otherwise documented below in the visit note. 

## 2014-05-16 NOTE — Assessment & Plan Note (Signed)
Suspect multifactorial Lab today

## 2014-05-16 NOTE — Assessment & Plan Note (Signed)
Check tsh Could also be hormone related / perimenopause

## 2014-05-16 NOTE — Assessment & Plan Note (Signed)
Reassuring exam-no signs of synovitis Mother has RA Check RF and ANA Disc poss of menopausal symptoms as well

## 2014-05-16 NOTE — Progress Notes (Signed)
Subjective:    Patient ID: Alice Page, female    DOB: 1966-09-10, 48 y.o.   MRN: [id number]  HPI Here with "a bunch of weird symptoms"  Hair started falling out - for a few months - all over and getting thinner - a lot in her brush but not on shower floor This happened in the past when she was iron def  Her periods are generally heavy  Last labs - (not here) - thinks Hb was around 11 - about a mo ago  She takes iron daily - more compliant lately   Waves of nausea also  Uses condoms for birth control - despite regular periods for the past 2 mo - due for menses in a week   Also a bit dizzy - ? Lightheaded moreso - has a wave and then goes quickly  No falls or head injury   She had hot flashes a while back  Not now  Periods are irregular in general   She has aches and pains in her joints all over - esp knees and elbows  No swelling or redness of joints  No rash   Problems concentrating / memory is fuzzy   Patient Active Problem List   Diagnosis Date Noted  . Abdominal pain, epigastric 03/21/2014  . Microscopic hematuria 03/21/2014  . Tonsillar debris 07/31/2012  . Migraine without aura, not refractory 07/31/2012  . ANEMIA, IRON DEFICIENCY 08/08/2009  . ECCHYMOSES, SPONTANEOUS 08/08/2009  . SYNCOPE 10/05/2007  . FLANK PAIN, RIGHT 10/05/2007  . INCREASED INTRAOCULAR PRESSURE 07/21/2007  . ALLERGIC RHINITIS 07/21/2007   Past Medical History  Diagnosis Date  . History of allergic rhinitis   . Heavy menses   . Anemia   . History of recurrent UTIs    No past surgical history on file. History  Substance Use Topics  . Smoking status: Never Smoker   . Smokeless tobacco: Never Used  . Alcohol Use: No   No family history on file. Allergies  Allergen Reactions  . Cetirizine Hcl     REACTION: sedation  . Ciprofloxacin     REACTION: joint pain   Current Outpatient Prescriptions on File Prior to Visit  Medication Sig Dispense Refill  .  Aspirin-Acetaminophen-Caffeine (EXCEDRIN PO) Take by mouth as needed.      . ferrous sulfate 325 (65 FE) MG tablet Take 325 mg by mouth daily with breakfast.      . vitamin C (ASCORBIC ACID) 500 MG tablet Take 500 mg by mouth daily.       No current facility-administered medications on file prior to visit.    Review of Systems Review of Systems  Constitutional: Negative for fever, appetite change,  and unexpected weight change.  Eyes: Negative for pain and visual disturbance.  Respiratory: Negative for cough and shortness of breath.   Cardiovascular: Negative for cp or palpitations    Gastrointestinal: Negative for nausea, diarrhea and constipation.  Genitourinary: Negative for urgency and frequency.  Skin: Negative for pallor or rash  pos for hair loss  Neurological: Negative for weakness, light-headedness, numbness and headaches. MSK pos for joint pain intermittent   Hematological: Negative for adenopathy. Does not bruise/bleed easily.  Psychiatric/Behavioral: Negative for dysphoric mood. The patient is not nervous/anxious.  pos for trouble concentrating        Objective:   Physical Exam  Constitutional: She appears well-developed and well-nourished. No distress.  HENT:  Head: Normocephalic and atraumatic.  Mouth/Throat: Oropharynx is clear and moist.  Eyes: Conjunctivae and  EOM are normal. Pupils are equal, round, and reactive to light. Right eye exhibits no discharge. Left eye exhibits no discharge. No scleral icterus.  Neck: Normal range of motion. Neck supple. No JVD present. Carotid bruit is not present. No thyromegaly present.  Cardiovascular: Normal rate, regular rhythm and intact distal pulses.  Exam reveals no gallop.   Pulmonary/Chest: Effort normal and breath sounds normal. No respiratory distress. She has no wheezes. She has no rales.  Abdominal: Soft. Bowel sounds are normal. She exhibits no distension and no mass. There is no tenderness.  No suprapubic tenderness or  fullness    Musculoskeletal: She exhibits no edema and no tenderness.  No acute joint changes or crepitus or tenderness   Lymphadenopathy:    She has no cervical adenopathy.  Neurological: She is alert. She has normal reflexes. No cranial nerve deficit. She exhibits normal muscle tone. Coordination normal.  Skin: Skin is warm and dry. No rash noted. No erythema. No pallor.  Psychiatric: She has a normal mood and affect. Her mood appears not anxious. Her affect is not blunt and not labile. She does not exhibit a depressed mood.          Assessment & Plan:   Problem List Items Addressed This Visit     Musculoskeletal and Integument   Hair loss - Primary     Check tsh Could also be hormone related / perimenopause     Relevant Orders      TSH (Completed)     Other   Joint pain     Reassuring exam-no signs of synovitis Mother has RA Check RF and ANA Disc poss of menopausal symptoms as well     Relevant Orders      Vit D  25 hydroxy (rtn osteoporosis monitoring) (Completed)      ANA      Rheumatoid factor   Perimenopause     This may contrib to fatigue/ slowed cognition/ aches and pains and hair loss Disc in detail - irreg menses and other problems     Fatigue     Suspect multifactorial Lab today    Relevant Orders      CBC with Differential (Completed)      Comprehensive metabolic panel (Completed)      TSH (Completed)      Vitamin B12 (Completed)      Vit D  25 hydroxy (rtn osteoporosis monitoring) (Completed)

## 2014-05-16 NOTE — Assessment & Plan Note (Signed)
This may contrib to fatigue/ slowed cognition/ aches and pains and hair loss Disc in detail - irreg menses and other problems

## 2014-05-17 LAB — ANA: Anti Nuclear Antibody(ANA): NEGATIVE

## 2014-05-17 LAB — RHEUMATOID FACTOR

## 2014-05-25 ENCOUNTER — Telehealth: Payer: Self-pay

## 2014-05-25 ENCOUNTER — Ambulatory Visit (INDEPENDENT_AMBULATORY_CARE_PROVIDER_SITE_OTHER): Payer: BC Managed Care – PPO

## 2014-05-25 DIAGNOSIS — E538 Deficiency of other specified B group vitamins: Secondary | ICD-10-CM

## 2014-05-25 MED ORDER — CYANOCOBALAMIN 1000 MCG/ML IJ SOLN
1000.0000 ug | Freq: Once | INTRAMUSCULAR | Status: AC
  Administered 2014-05-25: 1000 ug via INTRAMUSCULAR

## 2014-05-25 NOTE — Telephone Encounter (Signed)
Some people just do not get enough B12 in their diet or absorb it well enough if they do  Since your level was not low (just low nl)- I want to just give the one B12 shot to ramp it up in your system  Then I predict taking a B complex vitamin daily for the long term would not be a bad idea to keep your level a little higher -and that may increase your energy level I have not personally seen any side effects of B12- but if you have any problems please alert us

## 2014-05-25 NOTE — Telephone Encounter (Signed)
Pt received Vit b 12 injection this morning at nurse visit. Pt wants to know if there are any side effects to taking B 12 injections or orally;how long will pt need to take oral B 12 and when will next vit b 12 lab test need to be done. Does pt need to have another B 12 injection or not. Pt requested copy of recent labs; copy given. Pt request cb.

## 2014-05-25 NOTE — Telephone Encounter (Signed)
Left voicemail requesting pt to call office 

## 2014-06-06 ENCOUNTER — Encounter: Payer: Self-pay | Admitting: *Deleted

## 2014-06-06 NOTE — Telephone Encounter (Signed)
Letter mailed with Dr. Tower's comments 

## 2014-08-09 ENCOUNTER — Other Ambulatory Visit: Payer: Self-pay

## 2014-08-09 DIAGNOSIS — Z1231 Encounter for screening mammogram for malignant neoplasm of breast: Secondary | ICD-10-CM

## 2014-09-12 ENCOUNTER — Ambulatory Visit
Admission: RE | Admit: 2014-09-12 | Discharge: 2014-09-12 | Disposition: A | Discharge status: Home / Self Care | Payer: BC Managed Care – PPO | Source: Ambulatory Visit

## 2014-09-12 DIAGNOSIS — Z1231 Encounter for screening mammogram for malignant neoplasm of breast: Secondary | ICD-10-CM

## 2014-09-21 ENCOUNTER — Ambulatory Visit: Payer: BC Managed Care – PPO | Admitting: Family Medicine

## 2014-09-27 ENCOUNTER — Ambulatory Visit (INDEPENDENT_AMBULATORY_CARE_PROVIDER_SITE_OTHER): Payer: BC Managed Care – PPO | Admitting: Family Medicine

## 2014-09-27 ENCOUNTER — Encounter: Payer: Self-pay | Admitting: Family Medicine

## 2014-09-27 ENCOUNTER — Telehealth: Payer: Self-pay

## 2014-09-27 ENCOUNTER — Ambulatory Visit (INDEPENDENT_AMBULATORY_CARE_PROVIDER_SITE_OTHER)
Admission: RE | Admit: 2014-09-27 | Discharge: 2014-09-27 | Disposition: A | Discharge status: Home / Self Care | Payer: BC Managed Care – PPO | Source: Ambulatory Visit | Attending: Family Medicine | Admitting: Family Medicine

## 2014-09-27 ENCOUNTER — Telehealth: Payer: Self-pay | Admitting: Family Medicine

## 2014-09-27 VITALS — BP 122/78 | HR 63 | Temp 98.4°F | Ht 68.0 in | Wt 142.0 lb

## 2014-09-27 DIAGNOSIS — M542 Cervicalgia: Secondary | ICD-10-CM

## 2014-09-27 DIAGNOSIS — R110 Nausea: Secondary | ICD-10-CM

## 2014-09-27 LAB — POCT URINE PREGNANCY: Preg Test, Ur: NEGATIVE

## 2014-09-27 MED ORDER — OMEPRAZOLE 20 MG PO CPDR: 20.0000 mg | DELAYED_RELEASE_CAPSULE | Freq: Every day | ORAL | Status: DC

## 2014-09-27 MED ORDER — CYCLOBENZAPRINE HCL 10 MG PO TABS: 5.0000 mg | ORAL_TABLET | Freq: Three times a day (TID) | ORAL | Status: DC | PRN

## 2014-09-27 NOTE — Progress Notes (Signed)
Subjective:    Patient ID: Alice PangShelly Bermea, female    DOB: May 01, 1966, 48 y.o.   MRN: [id number]  HPI Here with multiple symptoms  Woke up with a crick in her neck 4 wk ago  Advil/ice /heat/pillows - cannot get comfortable  Just not getting better  Now head and ear and eye hurt on that side    Still having intermittent nausea waves  On and off  occ wakes her up in the night  Brief and fleeting - no vomiting  No abd pain  Heartburn -occasional-not much   Periods are sporatic  No menses in 2 mo  Uses condoms for contraception    Patient Active Problem List   Diagnosis Date Noted  . Hair loss 05/16/2014  . Joint pain 05/16/2014  . Perimenopause 05/16/2014  . Fatigue 05/16/2014  . Abdominal pain, epigastric 03/21/2014  . Microscopic hematuria 03/21/2014  . Tonsillar debris 07/31/2012  . Migraine without aura, not refractory 07/31/2012  . ANEMIA, IRON DEFICIENCY 08/08/2009  . ECCHYMOSES, SPONTANEOUS 08/08/2009  . SYNCOPE 10/05/2007  . FLANK PAIN, RIGHT 10/05/2007  . INCREASED INTRAOCULAR PRESSURE 07/21/2007  . ALLERGIC RHINITIS 07/21/2007   Past Medical History  Diagnosis Date  . History of allergic rhinitis   . Heavy menses   . Anemia   . History of recurrent UTIs    No past surgical history on file. History  Substance Use Topics  . Smoking status: Never Smoker   . Smokeless tobacco: Never Used  . Alcohol Use: No   No family history on file. Allergies  Allergen Reactions  . Cetirizine Hcl     REACTION: sedation  . Ciprofloxacin     REACTION: joint pain   Current Outpatient Prescriptions on File Prior to Visit  Medication Sig Dispense Refill  . Aspirin-Acetaminophen-Caffeine (EXCEDRIN PO) Take by mouth as needed.    . ferrous sulfate 325 (65 FE) MG tablet Take 650 mg by mouth daily with breakfast.     . Omega-3 Fatty Acids (FISH OIL PO) Take 1 capsule by mouth daily.    . vitamin C (ASCORBIC ACID) 500 MG tablet Take 500 mg by mouth daily.     No  current facility-administered medications on file prior to visit.     Review of Systems Review of Systems  Constitutional: Negative for fever, appetite change, fatigue and unexpected weight change.  Eyes: Negative for pain and visual disturbance.  Respiratory: Negative for cough and shortness of breath.   Cardiovascular: Negative for cp or palpitations    Gastrointestinal: Negative for  diarrhea and constipation. neg for abd pain or blood in stool or black stool  Genitourinary: Negative for urgency and frequency.  Skin: Negative for pallor or rash   MSK pos for neck pain rad to head and ear  Neurological: Negative for weakness, light-headedness, numbness and pos for  headaches.  Hematological: Negative for adenopathy. Does not bruise/bleed easily.  Psychiatric/Behavioral: Negative for dysphoric mood. The patient is not nervous/anxious.         Objective:   Physical Exam  Constitutional: She appears well-developed and well-nourished. No distress.  HENT:  Head: Normocephalic and atraumatic.  Right Ear: External ear normal.  Left Ear: External ear normal.  Nose: Nose normal.  Mouth/Throat: Oropharynx is clear and moist.  No sinus or temple tenderness   Eyes: Conjunctivae and EOM are normal. Pupils are equal, round, and reactive to light. Right eye exhibits no discharge. Left eye exhibits no discharge. No scleral icterus.  Neck: Neck  supple. No JVD present. No thyromegaly present.  Tight R paracervical muscles  Tender at insertion to occiput  Pain to fully flex and tilt head/rotate to the right   No crepitus or bony tenderness   Cardiovascular: Normal rate, regular rhythm and normal heart sounds.   Pulmonary/Chest: Effort normal and breath sounds normal. No respiratory distress. She has no wheezes. She has no rales.  Abdominal: Soft. Bowel sounds are normal. She exhibits no distension and no mass. There is no hepatosplenomegaly. There is tenderness in the epigastric area. There is  no rebound, no guarding, no CVA tenderness, no tenderness at McBurney's point and negative Murphy's sign.  Lymphadenopathy:    She has no cervical adenopathy.  Neurological: She is alert. She has normal reflexes. No cranial nerve deficit. She exhibits normal muscle tone. Coordination normal.  Skin: Skin is warm and dry. No rash noted. No erythema. No pallor.  Psychiatric: She has a normal mood and affect.          Assessment & Plan:   Problem List Items Addressed This Visit      Other   Nausea without vomiting - Primary    Suspect gastritis/gerd ppi has helped in the past Trial of omeprazole 20 mg and update Disc diet  Urine preg neg     Relevant Orders      POCT urine pregnancy (Completed)   Neck pain on right side    Pt desc "crick in her neck" that will not go away  Tender at insertion of R para cervical muscles at occiput -- this is causing headache /ear pain  Disc use of heat and nsaid Offered muscle relaxer Xray today No neuro s/s    Relevant Orders      DG Cervical Spine Complete (Completed)

## 2014-09-27 NOTE — Telephone Encounter (Signed)
-----   [facility] Man[facility] [place], [place] sent at 16/1/096012/05/2014  5:18 PM EST ----- Pt called and notified as instructed; pt does want referral for PT.

## 2014-09-27 NOTE — Patient Instructions (Signed)
Start omeprazole 20 mg daily for a month - if this does not eliminate nausea let me know If you take advil - take with food  Use heat on neck If you want a muscle relaxer let me know  Xray of neck now   May consider physical therapy = will decide after I get an xray report

## 2014-09-27 NOTE — Telephone Encounter (Signed)
Pt notified as instructed by phone.pt voiced understanding.

## 2014-09-27 NOTE — Telephone Encounter (Signed)
I will send in flexeril  Caution of sedation

## 2014-09-27 NOTE — Progress Notes (Signed)
Pre visit review using our clinic review tool, if applicable. No additional management support is needed unless otherwise documented below in the visit note. 

## 2014-09-27 NOTE — Telephone Encounter (Signed)
Pt left v/m; pt was seen earlier today and [facility]Please advise.

## 2014-09-28 NOTE — Assessment & Plan Note (Signed)
Pt desc "crick in her neck" that will not go away  Tender at insertion of R para cervical muscles at occiput -- this is causing headache /ear pain  Disc use of heat and nsaid Offered muscle relaxer Xray today No neuro s/s

## 2014-09-28 NOTE — Assessment & Plan Note (Signed)
Suspect gastritis/gerd ppi has helped in the past Trial of omeprazole 20 mg and update Disc diet  Urine preg neg

## 2014-10-13 ENCOUNTER — Ambulatory Visit: Payer: Self-pay | Admitting: Internal Medicine

## 2014-10-13 ENCOUNTER — Telehealth: Payer: Self-pay

## 2014-10-13 ENCOUNTER — Encounter: Payer: Self-pay | Admitting: Internal Medicine

## 2014-10-13 ENCOUNTER — Ambulatory Visit (INDEPENDENT_AMBULATORY_CARE_PROVIDER_SITE_OTHER): Payer: BC Managed Care – PPO | Admitting: Internal Medicine

## 2014-10-13 VITALS — BP 128/90 | HR 74 | Temp 98.0°F

## 2014-10-13 DIAGNOSIS — M542 Cervicalgia: Secondary | ICD-10-CM

## 2014-10-13 DIAGNOSIS — R42 Dizziness and giddiness: Secondary | ICD-10-CM

## 2014-10-13 DIAGNOSIS — R110 Nausea: Secondary | ICD-10-CM

## 2014-10-13 LAB — CBC
HCT: 40.2 % (ref 36.0–46.0)
Hemoglobin: 13.4 g/dL (ref 12.0–15.0)
MCHC: 33.4 g/dL (ref 30.0–36.0)
MCV: 91.4 fl (ref 78.0–100.0)
PLATELETS: 224 10*3/uL (ref 150.0–400.0)
RBC: 4.4 Mil/uL (ref 3.87–5.11)
RDW: 12.3 % (ref 11.5–15.5)
WBC: 7 10*3/uL (ref 4.0–10.5)

## 2014-10-13 LAB — COMPREHENSIVE METABOLIC PANEL
ALBUMIN: 4.1 g/dL (ref 3.5–5.2)
ALK PHOS: 29 U/L — AB (ref 39–117)
ALT: 12 U/L (ref 0–35)
AST: 18 U/L (ref 0–37)
BUN: 11 mg/dL (ref 6–23)
CALCIUM: 9.2 mg/dL (ref 8.4–10.5)
CHLORIDE: 105 meq/L (ref 96–112)
CO2: 23 mEq/L (ref 19–32)
Creatinine, Ser: 0.7 mg/dL (ref 0.4–1.2)
GFR: 102.82 mL/min (ref >60.00)
Glucose, Bld: 120 mg/dL — ABNORMAL HIGH (ref 70–99)
POTASSIUM: 3.6 meq/L (ref 3.5–5.1)
SODIUM: 136 meq/L (ref 135–145)
TOTAL PROTEIN: 7.5 g/dL (ref 6.0–8.3)
Total Bilirubin: 0.7 mg/dL (ref 0.2–1.2)

## 2014-10-13 MED ORDER — MECLIZINE HCL 50 MG PO TABS
25.0000 mg | ORAL_TABLET | Freq: Three times a day (TID) | ORAL | Status: DC | PRN
Start: 2014-10-13 — End: 2015-06-19

## 2014-10-13 NOTE — Patient Instructions (Signed)

## 2014-10-13 NOTE — Telephone Encounter (Signed)
Left message on voicemail.

## 2014-10-13 NOTE — Progress Notes (Signed)
Subjective:    Patient ID: Alice PangShelly Vandyke, female    DOB: 16-Jul-1966, 48 y.o.   MRN: [id number]  HPI  Pt presents to the clinic today with c/o dizziness. This started months ago but got much worse yesterday. She is barely able to stand or turn her head. She has had some associated pressure in her ears, headache, neck pain and nausea. She has been having dizzy spells for at least the last 6 months. They are intermittent. She was seen for the neck pain 09/27/14. Xray of cervical spine showed: IMPRESSION: Negative cervical spine radiographs. She was advised to treat the neck pain with NSAIDS and a muscle relaxer. She reports she has not used the muscle relexar, only the NSAIDS and a heating pad. She has not taken anything for the nausea or dizziness. She feels more unbalanced as oppossed to the room spinning. She denies chest pain, chest tightness or shortness of breath.  Review of Systems      Past Medical History  Diagnosis Date  . History of allergic rhinitis   . Heavy menses   . Anemia   . History of recurrent UTIs     Current Outpatient Prescriptions  Medication Sig Dispense Refill  . Aspirin-Acetaminophen-Caffeine (EXCEDRIN PO) Take by mouth as needed.    Marland Kitchen. b complex vitamins capsule Take 1 capsule by mouth daily.    . cyclobenzaprine (FLEXERIL) 10 MG tablet Take 0.5-1 tablets (5-10 mg total) by mouth 3 (three) times daily as needed for muscle spasms (watch out for sedation). 30 tablet 1  . ferrous sulfate 325 (65 FE) MG tablet Take 325 mg by mouth daily with breakfast.     . Omega-3 Fatty Acids (FISH OIL PO) Take 1 capsule by mouth daily.    Marland Kitchen. omeprazole (PRILOSEC) 20 MG capsule Take 1 capsule (20 mg total) by mouth daily. 30 capsule 0  . vitamin C (ASCORBIC ACID) 500 MG tablet Take 500 mg by mouth daily.     No current facility-administered medications for this visit.    Allergies  Allergen Reactions  . Cetirizine Hcl     REACTION: sedation  . Ciprofloxacin     REACTION:  joint pain    No family history on file.  History   Social History  . Marital Status: Married    Spouse Name: N/A    Number of Children: N/A  . Years of Education: N/A   Occupational History  . Not on file.   Social History Main Topics  . Smoking status: Never Smoker   . Smokeless tobacco: Never Used  . Alcohol Use: No  . Drug Use: No  . Sexual Activity: Not on file   Other Topics Concern  . Not on file   Social History Narrative     Constitutional: Pt reports headache. Denies fever, malaise, fatigue, or abrupt weight changes.  HEENT: Pt reports ear pressure. Denies eye pain, eye redness, ear pain, ringing in the ears, wax buildup, runny nose, nasal congestion, bloody nose, or sore throat. Respiratory: Denies difficulty breathing, shortness of breath, cough or sputum production.   Cardiovascular: Denies chest pain, chest tightness, palpitations or swelling in the hands or feet.  Gastrointestinal: Pt reports nausea. Denies abdominal pain, bloating, constipation, diarrhea or blood in the stool.  GU: Denies urgency, frequency, pain with urination, burning sensation, blood in urine, odor or discharge. Musculoskeletal: Pt reports generalized weakness and neck pain. Denies difficulty with gait, or joint swelling.  Skin: Denies redness, rashes, lesions or ulcercations.  Neurological: Pt reports dizziness. Denies difficulty with memory, difficulty with speech or problems with balance and coordination.   No other specific complaints in a complete review of systems (except as listed in HPI above).  Objective:   Physical Exam   BP 128/90 mmHg  Pulse 74  Temp(Src) 98 F (36.7 C) (Oral)  Wt   SpO2 99% Wt Readings from Last 3 Encounters:  09/27/14 142 lb (64.411 kg)  05/16/14 143 lb (64.864 kg)  03/21/14 140 lb 8 oz (63.73 kg)    General: Appears her stated age, in NAD. She can not keep her eyes open while talking to me. She can not walk without assistance. HEENT: Head:  normal shape and size; Eyes: sclera white, no icterus, conjunctiva pink, PERRLA and EOMs intact; Ears: Tm's gray and intact, normal light reflex; Nose: mucosa pink and moist, septum midline; Throat/Mouth: Teeth present, mucosa pink and moist, no exudate, lesions or ulcerations noted.  Neck: Pain with flexion. Normal extension. Pain with palpation at the base of the right occiput. Cardiovascular: Normal rate and rhythm. S1,S2 noted.  No murmur, rubs or gallops noted.  Pulmonary/Chest: Normal effort and positive vesicular breath sounds. No respiratory distress. No wheezes, rales or ronchi noted.  Abdomen: Soft, tender in the epigastric region. Normal bowel sounds, no bruits noted. No distention or masses noted. Liver, spleen and kidneys non palpable. Neurological: Somnolent but oriented. Positive Romberg. Coordination slow but normal.    BMET    Component Value Date/Time   NA 140 05/16/2014 1453   K 4.1 05/16/2014 1453   CL 107 05/16/2014 1453   CO2 28 05/16/2014 1453   GLUCOSE 109* 05/16/2014 1453   BUN 13 05/16/2014 1453   CREATININE 0.7 05/16/2014 1453   CALCIUM 9.4 05/16/2014 1453    Lipid Panel  No results found for: CHOL, TRIG, HDL, CHOLHDL, VLDL, LDLCALC  CBC    Component Value Date/Time   WBC 6.0 05/16/2014 1453   RBC 3.97 05/16/2014 1453   HGB 11.8* 05/16/2014 1453   HCT 35.7* 05/16/2014 1453   PLT 214.0 05/16/2014 1453   MCV 90.0 05/16/2014 1453   MCHC 33.1 05/16/2014 1453   RDW 14.5 05/16/2014 1453   LYMPHSABS 1.6 05/16/2014 1453   MONOABS 0.5 05/16/2014 1453   EOSABS 0.1 05/16/2014 1453   BASOSABS 0.0 05/16/2014 1453    Hgb A1C No results found for: HGBA1C      Assessment & Plan:   Dizziness, nausea, neck pain:  Xray and prior labs reviewed Will repeat CBC and CMET today Will obtain STAT CT Head given her somewhat altered mental state eRx for Meclizine TID prn for dizziness and nausea If CT head normal, take the muscle relaxer for the neck Go ahead and  make a follow up appt with Dr. Milinda Antisower early next week  Will follow up with you after the CT scan, to ER if worse

## 2014-10-13 NOTE — Progress Notes (Signed)
Pre visit review using our clinic review tool, if applicable. No additional management support is needed unless otherwise documented below in the visit note. 

## 2014-10-26 NOTE — Telephone Encounter (Signed)
Left message on voicemail--update on how pt is feeling being as though CT of head was normal

## 2014-10-27 NOTE — Telephone Encounter (Signed)
Went to ENT 10/24/2013 and was Dx with inner ear infection--still feeling imbalanced...but a little better than at her OV

## 2015-05-05 LAB — HM PAP SMEAR

## 2015-05-31 ENCOUNTER — Ambulatory Visit (INDEPENDENT_AMBULATORY_CARE_PROVIDER_SITE_OTHER): Payer: BLUE CROSS/BLUE SHIELD | Admitting: Family Medicine

## 2015-05-31 ENCOUNTER — Encounter: Payer: Self-pay | Admitting: Family Medicine

## 2015-05-31 VITALS — BP 122/85 | HR 62 | Temp 97.9°F | Ht 68.0 in | Wt 137.8 lb

## 2015-05-31 DIAGNOSIS — IMO0001 Reserved for inherently not codable concepts without codable children: Secondary | ICD-10-CM

## 2015-05-31 DIAGNOSIS — R030 Elevated blood-pressure reading, without diagnosis of hypertension: Secondary | ICD-10-CM | POA: Insufficient documentation

## 2015-05-31 NOTE — Progress Notes (Signed)
Subjective:    Patient ID: Alice Page, female    DOB: 1966/09/25, 49 y.o.   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 Nausea without vomiting 09/27/2014  . Neck pain on right side 09/27/2014  . Hair loss 05/16/2014  . Joint pain 05/16/2014  . Perimenopause 05/16/2014  . Fatigue 05/16/2014  . Abdominal pain, epigastric 03/21/2014  . Microscopic hematuria 03/21/2014  . Tonsillar debris 07/31/2012  . Migraine without aura, not refractory 07/31/2012  . ANEMIA, IRON DEFICIENCY 08/08/2009  . ECCHYMOSES, SPONTANEOUS 08/08/2009  . SYNCOPE 10/05/2007  . FLANK PAIN, RIGHT 10/05/2007  . INCREASED INTRAOCULAR PRESSURE 07/21/2007  . ALLERGIC RHINITIS 07/21/2007   Past Medical History  Diagnosis Date  . History of allergic rhinitis   . Heavy menses   . Anemia   . History of recurrent UTIs    No past surgical history  on file. Social History  Substance Use Topics  . Smoking status: Never Smoker   . Smokeless tobacco: Never Used  . Alcohol Use: No   No family history on file. Allergies  Allergen Reactions  . Cetirizine Hcl     REACTION: sedation  . Ciprofloxacin     REACTION: joint pain   Current Outpatient Prescriptions on File Prior to Visit  Medication Sig Dispense Refill  . b complex vitamins capsule Take 1 capsule by mouth daily.    . meclizine (ANTIVERT) 50 MG tablet Take 0.5 tablets (25 mg total) by mouth 3 (three) times daily as needed. 30 tablet 0  . vitamin C (ASCORBIC ACID) 500 MG tablet Take 500 mg by mouth daily.    . Aspirin-Acetaminophen-Caffeine (EXCEDRIN PO) Take by mouth as needed.    . cyclobenzaprine (FLEXERIL) 10 MG tablet Take 0.5-1 tablets (5-10 mg total) by mouth 3 (three) times daily as needed for muscle spasms (watch out for sedation). (Patient not taking: Reported on 05/31/2015) 30 tablet 1  . ferrous sulfate 325 (65 FE) MG tablet Take 325 mg by mouth daily with breakfast.     . Omega-3 Fatty Acids (FISH OIL PO) Take 1 capsule by mouth daily.    Marland Kitchen omeprazole (  PRILOSEC) 20 MG capsule Take 1 capsule (20 mg total) by mouth daily. (Patient not taking: Reported on 05/31/2015) 30 capsule 0   No current facility-administered medications on file prior to visit.       Review of Systems    Review of Systems  Constitutional: Negative for fever, appetite change, fatigue and unexpected weight change.  Eyes: Negative for pain and visual disturbance.  Respiratory: Negative for cough and shortness of breath.   Cardiovascular: Negative for cp or palpitations    Gastrointestinal: Negative for nausea, diarrhea and constipation.  Genitourinary: Negative for urgency and frequency.  Skin: Negative for pallor or rash   Neurological: Negative for weakness, light-headedness, numbness and pos for occ headaches.  Hematological: Negative for adenopathy. Does not bruise/bleed easily.    Psychiatric/Behavioral: Negative for dysphoric mood. The patient is not nervous/anxious.      Objective:   Physical Exam  Constitutional: She appears well-developed and well-nourished. No distress.  HENT:  Head: Normocephalic and atraumatic.  Mouth/Throat: Oropharynx is clear and moist.  Eyes: Conjunctivae and EOM are normal. Pupils are equal, round, and reactive to light.  Neck: Normal range of motion. Neck supple. No JVD present. Carotid bruit is not present. No thyromegaly present.  Cardiovascular: Normal rate, regular rhythm, normal heart sounds and intact distal pulses.  Exam reveals no gallop.   Pulmonary/Chest: Effort normal and breath sounds normal. No respiratory distress. She has no wheezes. She has no rales.  No crackles  Abdominal: Soft. Bowel sounds are normal. She exhibits no distension, no abdominal bruit and no mass. There is no tenderness.  Musculoskeletal: She exhibits no edema.  Lymphadenopathy:    She has no cervical adenopathy.  Neurological: She is alert. She has normal reflexes.  Skin: Skin is warm and dry. No rash noted.  Psychiatric: She has a normal mood and affect.          Assessment & Plan:   Problem List Items Addressed This Visit    Elevated blood pressure - Primary    Much better on 2nd check while relaxed BP: 122/85 mmHg   Given handouts on essential HTN and dash diet  She has great health habits Plan f/u for annual exam in about 6 mo with lab prior-will continue to watch Rev poss symptoms as well - will f/u if any problems

## 2015-05-31 NOTE — Assessment & Plan Note (Signed)
Much better on 2nd check while relaxed BP: 122/85 mmHg   Given handouts on essential HTN and dash diet  She has great health habits Plan f/u for annual exam in about 6 mo with lab prior-will continue to watch Rev poss symptoms as well - will f/u if any problems

## 2015-05-31 NOTE — Patient Instructions (Signed)
Blood pressure is much better on the 2nd check  Keep up good lifestyle habits  Schedule an appt for an annual exam with labs prior in the winter

## 2015-05-31 NOTE — Progress Notes (Signed)
Pre visit review using our clinic review tool, if applicable. No additional management support is needed unless otherwise documented below in the visit note. 

## 2015-06-16 ENCOUNTER — Telehealth: Payer: Self-pay | Admitting: Family Medicine

## 2015-06-16 NOTE — Telephone Encounter (Signed)
[facility] Day - Client TELEPHONE ADVICE RECORD   [facility]     Patient Name: Alice Page Client [facility] Day - Client    Client Site [facility] [place] - Day    Physician [place], [place]     Contact Type Call  Gender: Female Call Type Triage / Clinical  DOB: [birth year]  Relationship To Patient Self  Age: 49 Y 61 D Return Phone Number [phone] (Primary)  Return Phone Number: [phone] (Primary) Chief Complaint Numbness  Address:  Initial Comment [name] states she hurt her back about 3 weeks ago. Has numbness in legs and feet.   City/State/Zip: [place]  PreDisposition Call Doctor    Nurse Assessment  Nurse: Logan Bores, RN, Melissa Date/Time (Eastern Time): 06/16/2015 3:26:12 PM  Confirm and document reason for call. If symptomatic, describe symptoms. ---[name] states she hurt her back about 3 weeks ago. Has numbness in legs and feet.   Has the patient traveled out of the country within the last 30 days? ---Not Applicable   Does the patient require triage? ---Yes   Related visit to physician within the last 2 weeks? ---No   Does the PT have any chronic conditions? (i.e. diabetes, asthma, etc.) ---Yes   List chronic conditions. ---chronic back problems- injured 5 yrs ago,   Did the patient indicate they were pregnant? ---No     Guidelines      Guideline Title Affirmed Question Affirmed Notes Nurse Date/Time Lamount Cohen Time)  Neurologic Deficit Back pain (and neurologic deficit)  Logan Bores, RN, Melissa 06/16/2015 3:27:55 PM  Disp. Time Lamount Cohen Time) Disposition Final User         06/16/2015 3:31:09 PM See Physician within 4 Hours (or PCP triage) Yes Logan Bores, RN, Efraim Kaufmann         [name] Understands: Yes   Disagree/Comply: Comply      Care Advice Given Per Guideline         SEE PHYSICIAN WITHIN 4 HOURS (or PCP triage): CALL BACK IF: * You become worse.   After Care Instructions Given     Call Event Type User  Date / Time Description             Comments  User: Ardeen Garland, RN Date/Time (Eastern Time): 06/16/2015 3:44:08 PM  [name] reports she prefers not to go to [facility] [facility], the long ride she reports would further aggravate her symptoms.[name] declined to go to any urgent clinic or ER today. Asking for appt with Dr. Dallas Schimke on Monday. Schedule caller with Dr. Dallas Schimke for appt.

## 2015-06-16 NOTE — Telephone Encounter (Addendum)
Alice Page with [facility] called that pt was advised needed to be seen today but pt refused. I spoke with pt and numbness in legs is intermittent on and off for 3 weeks; pt can walk without assistance and does not have weakness in her legs. Pt is OK with waiting for appt on 06/19/15 and if condition changes or worsens over weekend pt will go to [facility] or ED. Pt did not want to go to [facility]. Will send to Alice Page since 06/19/15 appt is with him and will send to Alice Page who is in the office today as PCP. Alice Page tried to correct the way the TH note was sent with the long line of individual letters but when came across to pts chart remained the same.

## 2015-06-16 NOTE — Telephone Encounter (Signed)
[facility] Day - Client °TELEPHONE ADVICE RECORD °  °[facility] °  °  °Patient Name: Alice Page Client [facility] Day - Client  °  Client Site [facility] - Day  °  Physician [place], [place]   °  Contact Type Call  °Gender: Female Call Type Triage / Clinical  °DOB: 01/30/1966  Relationship To Patient Self  °Age: 49 Y 11 D Return Phone Number [phone] (Primary)  °Return Phone Number: [phone] (Primary) Chief Complaint Numbness  °Address:  Initial Comment [name] states she hurt her back about 3 weeks ago. Has numbness in legs and feet.   °City/State/Zip: [place]  PreDisposition Call Doctor  °  °Nurse Assessment  °Nurse: Evans, RN, Melissa Date/Time (Eastern Time): 06/16/2015 3:26:12 PM  °Confirm and document reason for call. If symptomatic, describe symptoms. ---[name] states she hurt her back about 3 weeks ago. Has numbness in legs and feet.   °Has the patient traveled out of the country within the last 30 days? ---Not Applicable   °Does the patient require triage? ---Yes   °Related visit to physician within the last 2 weeks? ---No   °Does the PT have any chronic conditions? (i.e. diabetes, asthma, etc.) ---Yes   °List chronic conditions. ---chronic back problems- injured 5 yrs ago,   °Did the patient indicate they were pregnant? ---No   °  °Guidelines      °Guideline Title Affirmed Question Affirmed Notes Nurse Date/Time (Eastern Time)  °Neurologic Deficit Back pain (and neurologic deficit)  Evans, RN, Melissa 06/16/2015 3:27:55 PM  °Disp. Time (Eastern Time) Disposition Final User   °      °06/16/2015 3:31:09 PM See Physician within 4 Hours (or PCP triage) Yes Evans, RN, Melissa   °   °   °[name] Understands: Yes   °Disagree/Comply: Comply   °   °Care Advice Given Per Guideline   °   °   °SEE PHYSICIAN WITHIN 4 HOURS (or PCP triage): CALL BACK IF: * You become worse.   °After Care Instructions Given     °Call Event Type User  Date / Time Description  °     °  °  °  °Comments  °User: Melissa, Evans, RN Date/Time (Eastern Time): 06/16/2015 3:44:08 PM  °[name] reports she prefers not to go to [facility], the long ride she reports would further aggravate her symptoms.[name] declined to go to any urgent clinic or ER today. Asking for appt with Dr. Copeland on Monday. Schedule caller with Dr. Copeland for appt.  °  °  °  °  °  ° ° °

## 2015-06-16 NOTE — Telephone Encounter (Signed)
I have never met patient, but f/u on Monday sounds appropriate in a patient with 3 weeks of back pain. +/- radiculopathy would still be fine with Monday dispo.  Cc: Dr. Tower 

## 2015-06-19 ENCOUNTER — Encounter: Payer: Self-pay | Admitting: Family Medicine

## 2015-06-19 ENCOUNTER — Ambulatory Visit (INDEPENDENT_AMBULATORY_CARE_PROVIDER_SITE_OTHER): Payer: BLUE CROSS/BLUE SHIELD | Admitting: Family Medicine

## 2015-06-19 VITALS — BP 126/94 | HR 70 | Temp 98.4°F | Ht 68.0 in | Wt 138.5 lb

## 2015-06-19 DIAGNOSIS — M5416 Radiculopathy, lumbar region: Secondary | ICD-10-CM

## 2015-06-19 NOTE — Progress Notes (Signed)
Dr. Karleen Hampshire T. Torina Ey, MD, CAQ [facility] [address] [place], [address] Phone: [phone] Fax: [phone]  06/19/2015  Patient: Alice Page, MRN: [id number], DOB: 05/07/1966, 49 y.o.  Primary Physician:  Roxy Manns, MD  Chief Complaint: Back Pain and Numbness  Subjective:   Alice Page is a 49 y.o. very pleasant female patient who presents with the following: Back Pain  Initially hurt her back about five or six years ago. Was in a rolle and going backwards and did not fall and lost her balance. Started to flare up and started to get some numbness down the back of her legs and her back got better in 4-6 weeks.   Then intermittently for the last several years, when she lifts something to heavy it will flare up. It will settle down over time. About a year ago, mowed multiple yards.   Saw Glennis Brink at [facility] and did some PT. Eventually got better. XR basically normal then. 3 weeks ago, moved some dining room chairs. Got up and numbness - pain is manageable.  No weakness.   No incontinence.   Numbness - before it would dissipate gradually.  Both legs.  Saturday came around to the top of her foot - usually back of legs.   No bowel or bladder incontinence. No focal weakness. Prior interventions: none Physical therapy: YES Chiropractic manipulations: No Acupuncture: No Osteopathic manipulation: No Heat or cold: helps some  Past Medical History, Surgical History, Family History, Medications, Allergies have been reviewed and updated if relevant.  Patient Active Problem List   Diagnosis Date Noted  . Elevated blood pressure 05/31/2015  . Nausea without vomiting 09/27/2014  . Neck pain on right side 09/27/2014  . Hair loss 05/16/2014  . Joint pain 05/16/2014  . Perimenopause 05/16/2014  . Fatigue 05/16/2014  . Abdominal pain, epigastric 03/21/2014  . Microscopic hematuria 03/21/2014  . Tonsillar  debris 07/31/2012  . Migraine without aura, not refractory 07/31/2012  . ANEMIA, IRON DEFICIENCY 08/08/2009  . ECCHYMOSES, SPONTANEOUS 08/08/2009  . SYNCOPE 10/05/2007  . FLANK PAIN, RIGHT 10/05/2007  . INCREASED INTRAOCULAR PRESSURE 07/21/2007  . ALLERGIC RHINITIS 07/21/2007    Past Medical History  Diagnosis Date  . History of allergic rhinitis   . Heavy menses   . Anemia   . History of recurrent UTIs     No past surgical history on file.  Social History   Social History  . Marital Status: Married    Spouse Name: N/A  . Number of Children: N/A  . Years of Education: N/A   Occupational History  . Not on file.   Social History Main Topics  . Smoking status: Never Smoker   . Smokeless tobacco: Never Used  . Alcohol Use: No  . Drug Use: No  . Sexual Activity: Not on file   Other Topics Concern  . Not on file   Social History Narrative    No family history on file.  Allergies  Allergen Reactions  . Cetirizine Hcl     REACTION: sedation  . Ciprofloxacin     REACTION: joint pain    Medication list reviewed and updated in full in [facility].  GEN: No fevers, chills. Nontoxic. Primarily MSK c/o today. MSK: Detailed in the HPI GI: tolerating PO intake without difficulty Neuro: As above  Otherwise the pertinent positives of the ROS are noted above.    Objective:   Blood pressure 126/94, pulse  70, temperature 98.4 F (36.9 C), temperature source Oral, height 5\' 8"  (1.727 m), weight 138 lb 8 oz (62.823 kg).  Gen: Well-developed,well-nourished,in no acute distress; alert,appropriate and cooperative throughout examination HEENT: Normocephalic and atraumatic without obvious abnormalities.  Ears, externally no deformities Pulm: Breathing comfortably in no respiratory distress Range of motion at  the waist: Flexion, rotation and lateral bending: relatively normal flexion, lateral bending, and rotation.  Extension causes some pain.  No echymosis or  edema Rises to examination table with no difficulty Gait: minimally antalgic  Inspection/Deformity: No abnormality Paraspinus T:  Mild pain around L3-L5  B Ankle Dorsiflexion (L5,4): 5/5 B Great Toe Dorsiflexion (L5,4): 5/5 Heel Walk (L5): WNL Toe Walk (S1): WNL Rise/Squat (L4): WNL, mild pain  SENSORY B Medial Foot (L4): WNL B Dorsum (L5): WNL B Lateral (S1): WNL Light Touch: WNL Pinprick: WNL  REFLEXES Knee (L4): 2+ Ankle (S1): 2+  B SLR, seated: neg B SLR, supine: neg B FABER: neg B Reverse FABER: neg B Greater Troch: NT B Log Roll: neg B Stork: NT B Sciatic Notch: NT  Radiology: No results found.  Assessment and Plan:   Bilateral lumbar radiculopathy  >25 minutes spent in face to face time with patient, >50% spent in counselling or coordination of care: intermittent radiculopathy for 5 years.  By report normal x-rays.  Most likely this would represent disc pathology with nerve compression.  At this point, on exam I can find no neurological deficits.  Subjective radiculopathy.  At this point, the patient would basically like to avoid all medications with the exception of some NSAIDs.  OTC.  Reviewed anatomy with the patient.  If she is not improving in the next 3 or 4 weeks, I think a dose of some prednisone would be a reasonable next step.  Recommended Pilates or yoga long-term.  Reviewed core and hip rehabilitation that she can start now.  Follow-up: 1 mo if not better  New Prescriptions   No medications on file   No orders of the defined types were placed in this encounter.    Signed,  Elpidio Galea. Macel Yearsley, MD   Patient's Medications  New Prescriptions   No medications on file  Previous Medications   ASPIRIN-ACETAMINOPHEN-CAFFEINE (EXCEDRIN PO)    Take by mouth as needed.   B COMPLEX VITAMINS CAPSULE    Take 1 capsule by mouth daily.   OMEGA-3 FATTY ACIDS (FISH OIL PO)    Take 1 capsule by mouth daily.  Modified Medications   No medications on  file  Discontinued Medications   CYCLOBENZAPRINE (FLEXERIL) 10 MG TABLET    Take 0.5-1 tablets (5-10 mg total) by mouth 3 (three) times daily as needed for muscle spasms (watch out for sedation).   FERROUS SULFATE 325 (65 FE) MG TABLET    Take 325 mg by mouth daily with breakfast.    MECLIZINE (ANTIVERT) 50 MG TABLET    Take 0.5 tablets (25 mg total) by mouth 3 (three) times daily as needed.   OMEPRAZOLE (PRILOSEC) 20 MG CAPSULE    Take 1 capsule (20 mg total) by mouth daily.   VITAMIN C (ASCORBIC ACID) 500 MG TABLET    Take 500 mg by mouth daily.

## 2015-06-19 NOTE — Progress Notes (Signed)
Pre visit review using our clinic review tool, if applicable. No additional management support is needed unless otherwise documented below in the visit note. 

## 2015-07-10 ENCOUNTER — Ambulatory Visit (INDEPENDENT_AMBULATORY_CARE_PROVIDER_SITE_OTHER): Payer: BLUE CROSS/BLUE SHIELD | Admitting: Family Medicine

## 2015-07-10 ENCOUNTER — Encounter: Payer: Self-pay | Admitting: Family Medicine

## 2015-07-10 VITALS — BP 110/82 | HR 60 | Temp 98.1°F | Ht 68.0 in | Wt 137.8 lb

## 2015-07-10 DIAGNOSIS — S60011A Contusion of right thumb without damage to nail, initial encounter: Secondary | ICD-10-CM

## 2015-07-10 NOTE — Progress Notes (Signed)
Pre visit review using our clinic review tool, if applicable. No additional management support is needed unless otherwise documented below in the visit note. 

## 2015-07-10 NOTE — Progress Notes (Signed)
Subjective:    Patient ID: Alice Page, female    DOB: [birth year], 49 y.o.   MRN: [id number]  HPI Here for a thumb injury Shut her R thumb in the door Tuesday a week ago  It really hurt - and a family member who is a doctor put a hole in the nail to release the blood   Still fairly painful -not as bad as it was Still functioning  Still swollen   Can use it  No oozing  Nail is loose   Patient Active Problem List   Diagnosis Date Noted  . Elevated blood pressure 05/31/2015  . Nausea without vomiting 09/27/2014  . Neck pain on right side 09/27/2014  . Hair loss 05/16/2014  . Joint pain 05/16/2014  . Perimenopause 05/16/2014  . Fatigue 05/16/2014  . Abdominal pain, epigastric 03/21/2014  . Microscopic hematuria 03/21/2014  . Tonsillar debris 07/31/2012  . Migraine without aura, not refractory 07/31/2012  . ANEMIA, IRON DEFICIENCY 08/08/2009  . ECCHYMOSES, SPONTANEOUS 08/08/2009  . SYNCOPE 10/05/2007  . FLANK PAIN, RIGHT 10/05/2007  . INCREASED INTRAOCULAR PRESSURE 07/21/2007  . ALLERGIC RHINITIS 07/21/2007   Past Medical History  Diagnosis Date  . History of allergic rhinitis   . Heavy menses   . Anemia   . History of recurrent UTIs    No past surgical history on file. Social History  Substance Use Topics  . Smoking status: Never Smoker   . Smokeless tobacco: Never Used  . Alcohol Use: No   No family history on file. Allergies  Allergen Reactions  . Cetirizine Hcl     REACTION: sedation  . Ciprofloxacin     REACTION: joint pain   Current Outpatient Prescriptions on File Prior to Visit  Medication Sig Dispense Refill  . Aspirin-Acetaminophen-Caffeine (EXCEDRIN PO) Take by mouth as needed.    Marland Kitchen b complex vitamins capsule Take 1 capsule by mouth daily.    . Omega-3 Fatty Acids (FISH OIL PO) Take 1 capsule by mouth daily.     No current facility-administered medications on file prior to visit.    Review of Systems Review of Systems    Constitutional: Negative for fever, appetite change, fatigue and unexpected weight change.  Eyes: Negative for pain and visual disturbance.  Respiratory: Negative for cough and shortness of breath.   Cardiovascular: Negative for cp or palpitations    Gastrointestinal: Negative for nausea, diarrhea and constipation.  Genitourinary: Negative for urgency and frequency.  Skin: Negative for pallor or rash  pos for pain of R thumb under nail Neurological: Negative for weakness, light-headedness, numbness and headaches.  Hematological: Negative for adenopathy. Does not bruise/bleed easily.  Psychiatric/Behavioral: Negative for dysphoric mood. The patient is not nervous/anxious.         Objective:   Physical Exam  Constitutional: She appears well-developed and well-nourished. No distress.  HENT:  Head: Normocephalic and atraumatic.  Eyes: Conjunctivae and EOM are normal. Pupils are equal, round, and reactive to light.  Neck: Normal range of motion. Neck supple.  Cardiovascular: Normal rate and regular rhythm.   Musculoskeletal: She exhibits edema and tenderness.  Lymphadenopathy:    She has no cervical adenopathy.  Neurological: She is alert.  Skin: Skin is warm and dry.  Contusion of R thumb at base of nail with ecchymosis and swelling  Sub ungual hematoma has been expressed with a hole in the nail  No signs of infection Some mild tenderness Thumb nail is becoming loose   Psychiatric: She has  a normal mood and affect.          Assessment & Plan:   Problem List Items Addressed This Visit      Other   Contusion of right thumb - Primary    From almost a week ago - with sub ungal hematoma that was evacuated at the time  Will likely loose the nail  For residual swelling -recommend ice (10 min at time), relative rest and wrapping in gauze if needed for support No s/s/ of infection  Pt did not desire xr -does not think it is painful enough for a fracture  Update if not starting  to improve in a week or if worsening

## 2015-07-10 NOTE — Assessment & Plan Note (Signed)
From almost a week ago - with sub ungal hematoma that was evacuated at the time  Will likely loose the nail  For residual swelling -recommend ice (10 min at time), relative rest and wrapping in gauze if needed for support No s/s/ of infection  Pt did not desire xr -does not think it is painful enough for a fracture  Update if not starting to improve in a week or if worsening

## 2015-07-10 NOTE — Patient Instructions (Signed)
Use ice /cold compress on your thumb for 10 minutes at a time as often as you can  If it feels better to wrap it - do so / not too tight  (don't put any adhesive on the nail) Anti inflammatories like ibuprofen or aleve are ok as directed Try to use left hand when you can   Update if not starting to improve in a week or if worsening

## 2015-09-28 ENCOUNTER — Telehealth: Payer: Self-pay | Admitting: Family Medicine

## 2015-09-28 NOTE — Telephone Encounter (Signed)
[facility]  Patient Name: Alice PangSHELLY Hoggard  DOB: [birth year]    Initial Comment [name] states has terrible pressure in her head, eye keeps running, can't hear well   Nurse Assessment  Nurse: Elwyn LadeBurress, RN, Misty StanleyLisa Date/Time (Eastern Time): 09/28/2015 4:49:33 PM  Confirm and document reason for call. If symptomatic, describe symptoms. ---[name] states has terrible pressure in her forehead/ cheek/ around eye/ teeth/left side of head, eye keeps watering, left ear congestion and nasal congestion, despite several sinus meds  Has the patient traveled out of the country within the last 30 days? ---Not Applicable  Does the patient have any new or worsening symptoms? ---Yes  Will a triage be completed? ---Yes  Related visit to physician within the last 2 weeks? ---No  Does the PT have any chronic conditions? (i.e. diabetes, asthma, etc.) ---No  Did the patient indicate they were pregnant? ---No  Is this a behavioral health or substance abuse call? ---No     Guidelines    Guideline Title Affirmed Question Affirmed Notes  Sinus Pain or Congestion [1] Redness or swelling on the cheek, forehead or around the eye AND [2] no fever    Final Disposition User   See Physician within 4 Hours (or PCP triage) Burress, RN, Misty StanleyLisa    Comments  attempted scheduling appt today, but caller states has one for tomorrow and does not want to go to [facility], despite reinforcing recommendation   Referrals  REFERRED TO PCP OFFICE   DISAGREED AND STATES WILL WAIT UNTIL APPT TOMORROW ALREADY SCHEDULED

## 2015-09-29 ENCOUNTER — Encounter: Payer: Self-pay | Admitting: Family Medicine

## 2015-09-29 ENCOUNTER — Ambulatory Visit (INDEPENDENT_AMBULATORY_CARE_PROVIDER_SITE_OTHER): Payer: BLUE CROSS/BLUE SHIELD | Admitting: Family Medicine

## 2015-09-29 VITALS — BP 132/86 | HR 68 | Temp 98.3°F | Ht 68.0 in | Wt 144.2 lb

## 2015-09-29 DIAGNOSIS — J019 Acute sinusitis, unspecified: Secondary | ICD-10-CM | POA: Insufficient documentation

## 2015-09-29 DIAGNOSIS — J0110 Acute frontal sinusitis, unspecified: Secondary | ICD-10-CM | POA: Diagnosis not present

## 2015-09-29 MED ORDER — AMOXICILLIN-POT CLAVULANATE 875-125 MG PO TABS: 1.0000 | ORAL_TABLET | Freq: Two times a day (BID) | ORAL | Status: DC

## 2015-09-29 NOTE — Patient Instructions (Signed)
Take augmentin as directed for sinus infection  mucinex to loosen phlegm/mucous in head and chest netti pot/spray as needed Breathe steam and use warm compresses  Aleve with food 2 pills every 12 hours for pain and inflammation    Update if not starting to improve in a week or if worsening

## 2015-09-29 NOTE — Telephone Encounter (Signed)
Pt was seen

## 2015-09-29 NOTE — Telephone Encounter (Signed)
Pt has appt 09/29/15 at 10:15 with Dr Milinda Antisower.

## 2015-09-29 NOTE — Assessment & Plan Note (Signed)
On L side -with poss exac of migraine also  tx with augmentin  Disc symptomatic care - see instructions on AVS  Update if not starting to improve in a week or if worsening

## 2015-09-29 NOTE — Progress Notes (Signed)
Subjective:    Patient ID: Alice Page, female    DOB: 1965-12-18, 49 y.o.   MRN: [id number]  HPI Here with uri symptoms   ST over the weekend  Then congestion Now severe sinus pain / teeth hurt and L eye hurts (pressure) and ear hurts   No fever  No aches or chills Not a lot of nasal discharge - some color /yellow  No nosebleed  Used a netti spray- it helped a lot   Some cough  Non productive   Has a hx of migraines but this feels more like sinus pain  L eye watering a lot also   Patient Active Problem List   Diagnosis Date Noted  . Contusion of right thumb 07/10/2015  . Elevated blood pressure 05/31/2015  . Nausea without vomiting 09/27/2014  . Neck pain on right side 09/27/2014  . Hair loss 05/16/2014  . Joint pain 05/16/2014  . Perimenopause 05/16/2014  . Fatigue 05/16/2014  . Abdominal pain, epigastric 03/21/2014  . Microscopic hematuria 03/21/2014  . Tonsillar debris 07/31/2012  . Migraine without aura, not refractory 07/31/2012  . ANEMIA, IRON DEFICIENCY 08/08/2009  . ECCHYMOSES, SPONTANEOUS 08/08/2009  . SYNCOPE 10/05/2007  . FLANK PAIN, RIGHT 10/05/2007  . INCREASED INTRAOCULAR PRESSURE 07/21/2007  . ALLERGIC RHINITIS 07/21/2007   Past Medical History  Diagnosis Date  . History of allergic rhinitis   . Heavy menses   . Anemia   . History of recurrent UTIs    No past surgical history on file. Social History  Substance Use Topics  . Smoking status: Never Smoker   . Smokeless tobacco: Never Used  . Alcohol Use: No   No family history on file. Allergies  Allergen Reactions  . Cetirizine Hcl     REACTION: sedation  . Ciprofloxacin     REACTION: joint pain   Current Outpatient Prescriptions on File Prior to Visit  Medication Sig Dispense Refill  . Aspirin-Acetaminophen-Caffeine (EXCEDRIN PO) Take by mouth as needed.    Marland Kitchen. b complex vitamins capsule Take 1 capsule by mouth daily.    . Omega-3 Fatty Acids (FISH OIL PO) Take 1 capsule by  mouth daily.     No current facility-administered medications on file prior to visit.     Review of Systems    Review of Systems  Constitutional: Negative for fever, appetite change,  and unexpected weight change.  ENT pos for cong and facial pain on L and ST Eyes: Negative for pain and visual disturbance.  Respiratory: Negative for wheeze and shortness of breath.   Cardiovascular: Negative for cp or palpitations    Gastrointestinal: Negative for nausea, diarrhea and constipation.  Genitourinary: Negative for urgency and frequency.  Skin: Negative for pallor or rash   Neurological: Negative for weakness, light-headedness, numbness and headaches.  Hematological: Negative for adenopathy. Does not bruise/bleed easily.  Psychiatric/Behavioral: Negative for dysphoric mood. The patient is not nervous/anxious.      Objective:   Physical Exam  Constitutional: She appears well-developed and well-nourished. No distress.  HENT:  Head: Normocephalic and atraumatic.  Right Ear: External ear normal.  Left Ear: External ear normal.  Mouth/Throat: Oropharynx is clear and moist. No oropharyngeal exudate.  Nares are injected and congested  L ethmoid and maxillary sinus tenderness  Post nasal drip   Throat is clear  Eyes: Conjunctivae and EOM are normal. Pupils are equal, round, and reactive to light. Right eye exhibits no discharge. Left eye exhibits no discharge. No scleral icterus.  Neck: Normal range of motion. Neck supple.  Cardiovascular: Normal rate and regular rhythm.   Pulmonary/Chest: Effort normal and breath sounds normal. No respiratory distress. She has no wheezes. She has no rales.  Lymphadenopathy:    She has no cervical adenopathy.  Neurological: She is alert. No cranial nerve deficit.  Skin: Skin is warm and dry. No rash noted.  Psychiatric: She has a normal mood and affect.          Assessment & Plan:   Problem List Items Addressed This Visit      Respiratory    Acute sinusitis - Primary    On L side -with poss exac of migraine also  tx with augmentin  Disc symptomatic care - see instructions on AVS  Update if not starting to improve in a week or if worsening        Relevant Medications   amoxicillin-clavulanate (AUGMENTIN) 875-125 MG tablet

## 2015-09-29 NOTE — Progress Notes (Signed)
Pre visit review using our clinic review tool, if applicable. No additional management support is needed unless otherwise documented below in the visit note. 

## 2015-10-05 LAB — HM MAMMOGRAPHY

## 2015-11-08 ENCOUNTER — Telehealth: Payer: Self-pay

## 2015-11-08 NOTE — Telephone Encounter (Signed)
Pt left v/m requesting med for h/a that pt has had since Friday sent to [facility]  . Pt has tried excedrin, aleve, excedrin PM and advil. Last seen acute visit 09/29/15. No dizziness,CP or SOB. Now h/a across top of head and forehead;sometimes upper teeth hurt. No fever.

## 2015-11-08 NOTE — Telephone Encounter (Signed)
It this a continuation of the sinus infection she had in Dec (did it not get better)? Or is this new  I wonder about sinus infection since teeth hurt Fever? Colored nasal d/c? Facial pain?

## 2015-11-09 MED ORDER — TRAMADOL HCL 50 MG PO TABS: 50.0000 mg | ORAL_TABLET | Freq: Three times a day (TID) | ORAL | Status: DC | PRN

## 2015-11-09 NOTE — Telephone Encounter (Signed)
Rx called in as prescribed and pt notified and advise if tramadol doesn't help she needs to f/u, pt verbalized understanding

## 2015-11-09 NOTE — Telephone Encounter (Signed)
We can try some tramadol - if that does not help she will need to be seen (may need a shot)  Please call in

## 2015-11-09 NOTE — Telephone Encounter (Signed)
Pt said she did get completely better after her sinus infection in Dec, pt doesn't think she has a sinus inf because she has no cough, and no congestion, and no sinus pain. Pt said she has a really bad migraine pt said it's so bad that it is hurting her eyes and she is light sensitive. Pt said she has tried Pt has tried excedrin, aleve, excedrin PM and advil, and nothing is helping the pain, pt said it started Friday and she did take OTC meds and it went away on Saturday, but then it came back on Sunday and nothing she has tried OTC has even "touched the pain", pt said it started on the right side of her head but now the pain is on the left side and she doesn't know what else to do.  Pt is requesting a med sent to [facility] instead of [facility] to help

## 2015-12-07 ENCOUNTER — Telehealth: Payer: Self-pay | Admitting: Family Medicine

## 2015-12-07 DIAGNOSIS — Z0000 Encounter for general adult medical examination without abnormal findings: Secondary | ICD-10-CM | POA: Insufficient documentation

## 2015-12-07 NOTE — Telephone Encounter (Signed)
-----   Message from Alvina Chou sent at 12/06/2015  6:10 PM EST ----- Regarding: Lab orders for Friday, 12.17.17 Patient is scheduled for CPX labs, please order future labs, Thanks , Camelia Eng

## 2015-12-08 ENCOUNTER — Other Ambulatory Visit (INDEPENDENT_AMBULATORY_CARE_PROVIDER_SITE_OTHER): Payer: BLUE CROSS/BLUE SHIELD

## 2015-12-08 DIAGNOSIS — Z0000 Encounter for general adult medical examination without abnormal findings: Secondary | ICD-10-CM | POA: Diagnosis not present

## 2015-12-08 LAB — COMPREHENSIVE METABOLIC PANEL
ALBUMIN: 4.5 g/dL (ref 3.5–5.2)
ALK PHOS: 33 U/L — AB (ref 39–117)
ALT: 12 U/L (ref 0–35)
AST: 15 U/L (ref 0–37)
BILIRUBIN TOTAL: 0.7 mg/dL (ref 0.2–1.2)
BUN: 17 mg/dL (ref 6–23)
CO2: 29 mEq/L (ref 19–32)
CREATININE: 0.82 mg/dL (ref 0.40–1.20)
Calcium: 10 mg/dL (ref 8.4–10.5)
Chloride: 104 mEq/L (ref 96–112)
GFR: 78.27 mL/min (ref >60.00)
GLUCOSE: 103 mg/dL — AB (ref 70–99)
Potassium: 4.1 mEq/L (ref 3.5–5.1)
SODIUM: 140 meq/L (ref 135–145)
TOTAL PROTEIN: 7.9 g/dL (ref 6.0–8.3)

## 2015-12-08 LAB — CBC WITH DIFFERENTIAL/PLATELET
BASOS ABS: 0 10*3/uL (ref 0.0–0.1)
Basophils Relative: 0.6 % (ref 0.0–3.0)
Eosinophils Absolute: 0.2 10*3/uL (ref 0.0–0.7)
Eosinophils Relative: 5.3 % — ABNORMAL HIGH (ref 0.0–5.0)
HCT: 39 % (ref 36.0–46.0)
HEMOGLOBIN: 13.3 g/dL (ref 12.0–15.0)
LYMPHS ABS: 1.2 10*3/uL (ref 0.7–4.0)
Lymphocytes Relative: 27.2 % (ref 12.0–46.0)
MCHC: 34.1 g/dL (ref 30.0–36.0)
MCV: 88.8 fl (ref 78.0–100.0)
MONO ABS: 0.4 10*3/uL (ref 0.1–1.0)
MONOS PCT: 9.9 % (ref 3.0–12.0)
NEUTROS PCT: 57 % (ref 43.0–77.0)
Neutro Abs: 2.5 10*3/uL (ref 1.4–7.7)
Platelets: 210 10*3/uL (ref 150.0–400.0)
RBC: 4.38 Mil/uL (ref 3.87–5.11)
RDW: 13 % (ref 11.5–15.5)
WBC: 4.5 10*3/uL (ref 4.0–10.5)

## 2015-12-08 LAB — LIPID PANEL
Cholesterol: 192 mg/dL (ref 0–200)
HDL: 89.2 mg/dL (ref >39.00)
LDL Cholesterol: 91 mg/dL (ref 0–99)
NONHDL: 102.93
Total CHOL/HDL Ratio: 2
Triglycerides: 60 mg/dL (ref 0.0–149.0)
VLDL: 12 mg/dL (ref 0.0–40.0)

## 2015-12-08 LAB — TSH: TSH: 1.7 u[IU]/mL (ref 0.35–4.50)

## 2015-12-12 ENCOUNTER — Other Ambulatory Visit: Payer: BLUE CROSS/BLUE SHIELD

## 2015-12-19 ENCOUNTER — Ambulatory Visit (INDEPENDENT_AMBULATORY_CARE_PROVIDER_SITE_OTHER): Payer: BLUE CROSS/BLUE SHIELD | Admitting: Family Medicine

## 2015-12-19 ENCOUNTER — Encounter: Payer: Self-pay | Admitting: Family Medicine

## 2015-12-19 VITALS — BP 128/68 | HR 73 | Temp 97.9°F | Ht 68.0 in | Wt 138.8 lb

## 2015-12-19 DIAGNOSIS — Z1211 Encounter for screening for malignant neoplasm of colon: Secondary | ICD-10-CM | POA: Insufficient documentation

## 2015-12-19 DIAGNOSIS — Z0000 Encounter for general adult medical examination without abnormal findings: Secondary | ICD-10-CM | POA: Diagnosis not present

## 2015-12-19 NOTE — Progress Notes (Signed)
Pre visit review using our clinic review tool, if applicable. No additional management support is needed unless otherwise documented below in the visit note. 

## 2015-12-19 NOTE — Assessment & Plan Note (Signed)
Reviewed health habits including diet and exercise and skin cancer prevention Reviewed appropriate screening tests for age  Also reviewed health mt list, fam hx and immunization status , as well as social and family history   See HPI Labs reviewed Take care of yourself  Let us know when your last tetanus shot was  Please do the IFOB stool kit for colon cancer screening   Use your sunscreen Don't skip meals

## 2015-12-19 NOTE — Assessment & Plan Note (Signed)
Pt declines colonoscopy at this time Will do ifob kit  No symptoms

## 2015-12-19 NOTE — Patient Instructions (Addendum)
Take care of yourself  Let us know when your last tetanus shot was  Please do the IFOB stool kit for colon cancer screening   Use your sunscreen Don't skip meals  

## 2015-12-19 NOTE — Progress Notes (Signed)
Subjective:    Patient ID: Alice Page, female    DOB: [birth year], 50 y.o.   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 Last one -will find out and let us know   Pap - last gyn exam was July 2016 -no abnormalities /at gyn    Colon cancer screening Wants to do the IFOB - declines colonoscopy  Flu shots-declines   Mm -per pt 12/16 - normal  Self breast exam-no lumps new/ but does not check regularly   Results for orders placed or performed in visit on 12/08/15  CBC with Differential/Platelet  Result Value Ref Range   WBC 4.5 4.0 - 10.5 K/uL   RBC 4.38 3.87 - 5.11 Mil/uL   Hemoglobin 13.3 12.0 - 15.0 g/dL   HCT 39.0 36.0 - 46.0 %   MCV 88.8 78.0 - 100.0 fl   MCHC 34.1 30.0 - 36.0 g/dL   RDW 13.0 11.5 - 15.5 %   Platelets 210.0 150.0 - 400.0 K/uL   Neutrophils Relative % 57.0 43.0 - 77.0 %   Lymphocytes Relative 27.2 12.0 - 46.0 %   Monocytes Relative 9.9 3.0 - 12.0 %   Eosinophils Relative 5.3 (H) 0.0 - 5.0 %   Basophils Relative 0.6 0.0 - 3.0 %   Neutro Abs 2.5 1.4 - 7.7 K/uL   Lymphs Abs 1.2 0.7 - 4.0 K/uL   Monocytes Absolute 0.4 0.1 - 1.0 K/uL   Eosinophils Absolute 0.2 0.0 - 0.7 K/uL   Basophils Absolute 0.0 0.0 - 0.1 K/uL  Comprehensive metabolic panel  Result Value Ref Range   Sodium 140 135 - 145 mEq/L   Potassium 4.1 3.5 - 5.1 mEq/L   Chloride 104 96 - 112 mEq/L   CO2 29 19 - 32 mEq/L   Glucose, Bld 103 (H) 70 - 99 mg/dL   BUN 17 6 - 23 mg/dL   Creatinine, Ser 0.82 0.40 - 1.20 mg/dL   Total Bilirubin 0.7 0.2 - 1.2 mg/dL   Alkaline Phosphatase 33 (L) 39 - 117 U/L   AST 15 0 - 37 U/L   ALT 12 0 - 35 U/L   Total Protein 7.9 6.0 - 8.3 g/dL   Albumin 4.5 3.5 - 5.2 g/dL   Calcium 10.0 8.4 - 10.5 mg/dL   GFR 78.27 >60.00 mL/min  Lipid panel  Result Value Ref Range   Cholesterol 192 0 - 200 mg/dL   Triglycerides 60.0 0.0 - 149.0 mg/dL   HDL 89.20 >39.00 mg/dL   VLDL 12.0 0.0 - 40.0 mg/dL   LDL Cholesterol 91 0 - 99 mg/dL   Total CHOL/HDL Ratio 2    NonHDL 102.93   TSH  Result Value Ref Range   TSH 1.70 0.35 - 4.50 uIU/mL    Very good cholesterol profile Glucose 103   Patient Active Problem List   Diagnosis Date Noted  . Colon cancer screening 12/19/2015  . Routine general medical examination at a health care facility 12/07/2015  . Acute sinusitis 09/29/2015  . Elevated blood pressure 05/31/2015  . Hair loss 05/16/2014  . Joint pain 05/16/2014  . Perimenopause  05/16/2014  . Microscopic hematuria 03/21/2014  . Tonsillar debris 07/31/2012  . Migraine without aura, not refractory 07/31/2012  . SYNCOPE 10/05/2007  . INCREASED INTRAOCULAR PRESSURE 07/21/2007  . ALLERGIC RHINITIS 07/21/2007   Past Medical History  Diagnosis Date  . History of allergic rhinitis   . Heavy menses   . Anemia   . History of recurrent UTIs    No past surgical history on file. Social History  Substance Use Topics  . Smoking status: Never Smoker   . Smokeless tobacco: Never Used  . Alcohol Use: No   No family history on file. Allergies  Allergen Reactions  . Cetirizine Hcl     REACTION: sedation  . Ciprofloxacin     REACTION: joint pain   Current Outpatient Prescriptions on File Prior to Visit  Medication Sig Dispense Refill  . Aspirin-Acetaminophen-Caffeine (EXCEDRIN PO) Take by mouth as needed.    Marland Kitchen b complex vitamins capsule Take 1 capsule by mouth. Takes a few times a month    . traMADol (ULTRAM) 50 MG tablet Take 1 tablet (50 mg total) by mouth every 8 (eight) hours as needed (for headache, watch for sedation). (Patient not taking: Reported on 12/19/2015) 20 tablet 0   No current facility-administered medications on file  prior to visit.     Review of Systems Review of Systems  Constitutional: Negative for fever, appetite change,  and unexpected weight change.  Eyes: Negative for pain and visual disturbance.  Respiratory: Negative for cough and shortness of breath.   Cardiovascular: Negative for cp or palpitations    Gastrointestinal: Negative for nausea, diarrhea and constipation.  Genitourinary: Negative for urgency and frequency.  Skin: Negative for pallor or rash   Neurological: Negative for weakness, light-headedness, numbness and headaches.  Hematological: Negative for adenopathy. Does not bruise/bleed easily.  Psychiatric/Behavioral: Negative for dysphoric mood. The patient is not nervous/anxious.         Objective:   Physical Exam  Constitutional: She appears well-developed and well-nourished. No distress.  Well appearing   HENT:  Head: Normocephalic and atraumatic.  Right Ear: External ear normal.  Left Ear: External ear normal.  Nose: Nose normal.  Mouth/Throat: Oropharynx is clear and moist.  Eyes: Conjunctivae and EOM are normal. Pupils are equal, round, and reactive to light. Right eye exhibits no discharge. Left eye exhibits no discharge. No scleral icterus.  Neck: Normal range of motion. Neck supple. No JVD present. Carotid bruit is not present. No thyromegaly present.  Cardiovascular: Normal rate, regular rhythm, normal heart sounds and intact distal pulses.  Exam reveals no gallop.   Pulmonary/Chest: Effort normal and breath sounds normal. No respiratory distress. She has no wheezes. She has no rales.  Abdominal: Soft. Bowel sounds are normal. She exhibits no distension and no mass. There is no tenderness.  Musculoskeletal: She exhibits no edema or tenderness.  Lymphadenopathy:    She has no cervical adenopathy.  Neurological: She is alert. She has normal reflexes. No cranial nerve deficit. She exhibits normal muscle tone. Coordination normal.  Skin: Skin is warm and dry. No rash  noted. No erythema. No pallor.  Solar aging and lentigo diffusely Few SK  Psychiatric: She has a normal mood and affect.          Assessment & Plan:   Problem List Items Addressed This Visit      Other   Colon cancer screening    Pt declines colonoscopy at this time Will do ifob kit  No symptoms  Relevant Orders   Fecal occult blood, imunochemical   Routine general medical examination at a health care facility - Primary    Reviewed health habits including diet and exercise and skin cancer prevention Reviewed appropriate screening tests for age  Also reviewed health mt list, fam hx and immunization status , as well as social and family history   See HPI Labs reviewed Take care of yourself  Let us know when your last tetanus shot was  Please do the IFOB stool kit for colon cancer screening   Use your sunscreen Don't skip meals

## 2016-01-04 ENCOUNTER — Other Ambulatory Visit (INDEPENDENT_AMBULATORY_CARE_PROVIDER_SITE_OTHER): Payer: BLUE CROSS/BLUE SHIELD

## 2016-01-04 DIAGNOSIS — Z1211 Encounter for screening for malignant neoplasm of colon: Secondary | ICD-10-CM

## 2016-01-04 LAB — FECAL OCCULT BLOOD, IMMUNOCHEMICAL: Fecal Occult Bld: NEGATIVE

## 2016-01-04 LAB — FECAL OCCULT BLOOD, GUAIAC: FECAL OCCULT BLD: NEGATIVE

## 2016-01-05 ENCOUNTER — Encounter: Payer: Self-pay | Admitting: *Deleted

## 2016-01-15 ENCOUNTER — Telehealth: Payer: Self-pay

## 2016-01-15 NOTE — Telephone Encounter (Signed)
Pt left v/m re; tetanus shot; left v/m requesting pt to cb; need to know when had last tetanus shot per office note 12/19/15.

## 2016-01-17 ENCOUNTER — Telehealth: Payer: Self-pay | Admitting: Family Medicine

## 2016-01-17 NOTE — Telephone Encounter (Signed)
Please make a nurse visit to update - if not on medicare do the Tdap please

## 2016-01-17 NOTE — Telephone Encounter (Signed)
3/30  °Pt aware °

## 2016-01-17 NOTE — Telephone Encounter (Signed)
Pt called back she thinks is was 2002.  Is it ok to schedule  Best number [phone]

## 2016-01-17 NOTE — Telephone Encounter (Signed)
Opened in error see phone note 3/27

## 2016-01-18 ENCOUNTER — Ambulatory Visit (INDEPENDENT_AMBULATORY_CARE_PROVIDER_SITE_OTHER): Payer: BLUE CROSS/BLUE SHIELD | Admitting: *Deleted

## 2016-01-18 DIAGNOSIS — Z23 Encounter for immunization: Secondary | ICD-10-CM | POA: Diagnosis not present

## 2016-01-25 ENCOUNTER — Telehealth: Payer: Self-pay

## 2016-01-25 NOTE — Telephone Encounter (Signed)
PLEASE NOTE: All timestamps contained within this report are represented as [place] Standard Time. CONFIDENTIALTY NOTICE: This fax transmission is intended only for the addressee. It contains information that is legally privileged, confidential or otherwise protected from use or disclosure. If you are not the intended recipient, you are strictly prohibited from reviewing, disclosing, copying using or disseminating any of this information or taking any action in reliance on or regarding this information. If you have received this fax in error, please notify us immediately by telephone so that we can arrange for its return to [place]. Phone: [phone], Toll-Free: [phone], Fax: [phone] Page: 1 of 2 Call Id: [id number] [facility] Night - Client TELEPHONE ADVICE RECORD [facility] Patient Name: Alice Page Gender: Female DOB: 06/05/1956 Age: 50 Y 7 M 21 D Return Phone Number: [phone] (Primary) Address: City/State/Zip: [place] Client [facility] Night - Client Client Site [facility] [place] - Night Contact Type Call Who Is Calling Patient / Member / Family / Caregiver Call Type Triage / Clinical Relationship To Patient Self Return Phone Number [phone] (Primary) Chief Complaint Arm Pain (known cause) Reason for Call Symptomatic / Request for Health Information Initial Comment caller states she got a tetanus shot last week - now has shooting pain all down her arm PreDisposition Call Doctor Translation No Nurse Assessment Nurse: Tawanna Cooler, RN, Pam Date/Time (Eastern Time): 01/24/2016 5:23:57 PM Confirm and document reason for call. If symptomatic, describe symptoms. You must click the next button to save text entered. ---caller states she got a tetanus shot last week - now has shooting pain all down her arm. No temp. Painful at the vaccine area. Has the patient traveled out of the country within the last  30 days? ---No Does the patient have any new or worsening symptoms? ---Yes Will a triage be completed? ---Yes Related visit to physician within the last 2 weeks? ---No Does the PT have any chronic conditions? (i.e. diabetes, asthma, etc.) ---No Is this a behavioral health or substance abuse call? ---No Guidelines Guideline Title Affirmed Question Affirmed Notes Nurse Date/Time Lamount Cohen Time) Arm Pain [1] MODERATE pain (e.g., interferes with normal activities) AND [2] present > 3 days Tawanna Cooler, RN, Pam 01/24/2016 5:30:41 PM Disp. Time Lamount Cohen Time) Disposition Final User 01/24/2016 5:36:05 PM See PCP When Office is Open (within 3 days) Yes Tawanna Cooler, RN, Cleon Gustin Understands: Yes PLEASE NOTE: All timestamps contained within this report are represented as [place] Standard Time. CONFIDENTIALTY NOTICE: This fax transmission is intended only for the addressee. It contains information that is legally privileged, confidential or otherwise protected from use or disclosure. If you are not the intended recipient, you are strictly prohibited from reviewing, disclosing, copying using or disseminating any of this information or taking any action in reliance on or regarding this information. If you have received this fax in error, please notify us immediately by telephone so that we can arrange for its return to [place]. Phone: [phone], Toll-Free: [phone], Fax: [phone] Page: 2 of 2 Call Id: [id number] Disagree/Comply: Comply Care Advice Given Per Guideline SEE PCP WITHIN 3 DAYS: * You need to be seen within 2 or 3 days. Call your doctor during regular office hours and make an appointment. An urgent care center is often the best source of care if your doctor's office is closed or you can't get an appointment. NOTE: If office will be open tomorrow, tell caller to call then, not in 3 days. PAIN MEDICINES: * For  pain relief, take acetaminophen, ibuprofen, or naproxen. * Use the lowest amount that  makes your pain feel better. CALL BACK IF: * Severe pain occurs * Arm swelling occurs * Signs of infection occur (e.g., spreading redness, warmth, fever) * You become worse. CARE ADVICE given per Arm Pain (Adult) guideline. NAPROXEN (E.G., ALEVE): Referrals REFERRED TO PCP OFFICE

## 2016-01-25 NOTE — Telephone Encounter (Signed)
Try warm compresses  Please schedule with first avail provider

## 2016-01-25 NOTE — Telephone Encounter (Signed)
Left message to call back  

## 2016-01-25 NOTE — Telephone Encounter (Signed)
Spoke to patient. She already made an appointment with Dr Milinda Antisower tomorrow.

## 2016-01-26 ENCOUNTER — Ambulatory Visit (INDEPENDENT_AMBULATORY_CARE_PROVIDER_SITE_OTHER): Payer: BLUE CROSS/BLUE SHIELD | Admitting: Family Medicine

## 2016-01-26 ENCOUNTER — Encounter: Payer: Self-pay | Admitting: Family Medicine

## 2016-01-26 VITALS — BP 136/92 | HR 64 | Temp 98.1°F | Ht 68.0 in | Wt 142.5 lb

## 2016-01-26 DIAGNOSIS — T50Z95A Adverse effect of other vaccines and biological substances, initial encounter: Secondary | ICD-10-CM | POA: Insufficient documentation

## 2016-01-26 MED ORDER — MELOXICAM 15 MG PO TABS: 15.0000 mg | ORAL_TABLET | Freq: Every day | ORAL | Status: DC

## 2016-01-26 NOTE — Assessment & Plan Note (Signed)
Shooting arm pain since Tdap immunization last week  Nl exam except for tenderness in deltoid area  No redness/warmth or swelling  Suggest meloxicam 15 mg daily for 1-2 weeks  Cold compress prn  Update if no improvement of if development of any neurologic  symptoms

## 2016-01-26 NOTE — Patient Instructions (Signed)
Use a cold compress if it helps  Take meloxicam 15 mg with food once daily for 1-2 weeks  If no improvement at that time let me know  I will look into the future re: whether you can get this again

## 2016-01-26 NOTE — Progress Notes (Signed)
Pre visit review using our clinic review tool, if applicable. No additional management support is needed unless otherwise documented below in the visit note. 

## 2016-01-26 NOTE — Progress Notes (Signed)
Subjective:    Patient ID: Alice Page, female    DOB: 09-Aug-1966, 50 y.o.   MRN: [id number]  HPI  Here for ? Rxn to Tdap 01/18/16  Had the shot - and it hurt - she felt like the needle was still in her arm after she took it out  Worked her arm as much as she could to help the pain  Woke her up from sleep a few nights   Then got worse  Took advil-did not touch it  excedrin helped -a bit more   Yesterday it was hurting so bad - that she tried an Training and development officeraleve (that helped a lot)  When it worse off 12 hours later-much worse   No swelling or redness or heat   Pain is sharp- from where the shot was going down her arm  Worst if she lifts her arm up  Also to drive   No tingling or numbness  Feels like an ache - and then sharper when she uses it  Entire arm   Patient Active Problem List   Diagnosis Date Noted  . Colon cancer screening 12/19/2015  . Routine general medical examination at a health care facility 12/07/2015  . Acute sinusitis 09/29/2015  . Elevated blood pressure 05/31/2015  . Hair loss 05/16/2014  . Joint pain 05/16/2014  . Perimenopause 05/16/2014  . Microscopic hematuria 03/21/2014  . Tonsillar debris 07/31/2012  . Migraine without aura, not refractory 07/31/2012  . SYNCOPE 10/05/2007  . INCREASED INTRAOCULAR PRESSURE 07/21/2007  . ALLERGIC RHINITIS 07/21/2007   Past Medical History  Diagnosis Date  . History of allergic rhinitis   . Heavy menses   . Anemia   . History of recurrent UTIs    No past surgical history on file. Social History  Substance Use Topics  . Smoking status: Never Smoker   . Smokeless tobacco: Never Used  . Alcohol Use: No   No family history on file. Allergies  Allergen Reactions  . Cetirizine Hcl     REACTION: sedation  . Ciprofloxacin     REACTION: joint pain   Current Outpatient Prescriptions on File Prior to Visit  Medication Sig Dispense Refill  . Aspirin-Acetaminophen-Caffeine (EXCEDRIN PO) Take by mouth as  needed.    Marland Kitchen. b complex vitamins capsule Take 1 capsule by mouth. Takes a few times a month    . traMADol (ULTRAM) 50 MG tablet Take 1 tablet (50 mg total) by mouth every 8 (eight) hours as needed (for headache, watch for sedation). 20 tablet 0   No current facility-administered medications on file prior to visit.    Review of Systems    Review of Systems  Constitutional: Negative for fever, appetite change, fatigue and unexpected weight change.  Eyes: Negative for pain and visual disturbance.  Respiratory: Negative for cough and shortness of breath.   Cardiovascular: Negative for cp or palpitations    Gastrointestinal: Negative for nausea, diarrhea and constipation.  Genitourinary: Negative for urgency and frequency.  Skin: Negative for pallor or rash  neg for redness or swelling of arm  MSK pos for L upper arm pain that radiates to the rest of the arm  Neurological: Negative for weakness, light-headedness, numbness and headaches.  Hematological: Negative for adenopathy. Does not bruise/bleed easily.  Psychiatric/Behavioral: Negative for dysphoric mood. The patient is not nervous/anxious.      Objective:   Physical Exam  Constitutional: She appears well-developed and well-nourished. No distress.  Slim and well appearing   Eyes: Conjunctivae  and EOM are normal. Pupils are equal, round, and reactive to light. No scleral icterus.  Neck: Normal range of motion. Neck supple.  Cardiovascular: Normal rate.   Pulmonary/Chest: Effort normal and breath sounds normal.  Musculoskeletal: She exhibits tenderness. She exhibits no edema.  Tenderness over L deltoid area  No erythema or swelling or warmth Nl rom of arm with discomfort   Lymphadenopathy:    She has no cervical adenopathy.  Neurological: She is alert. She has normal strength. She displays no atrophy. No cranial nerve deficit or sensory deficit. She exhibits normal muscle tone. Coordination normal.  Skin: Skin is warm and dry. No  rash noted. No erythema.  Psychiatric: She has a normal mood and affect.          Assessment & Plan:   Problem List Items Addressed This Visit      Other   Immunization reaction - Primary    Shooting arm pain since Tdap immunization last week  Nl exam except for tenderness in deltoid area  No redness/warmth or swelling  Suggest meloxicam 15 mg daily for 1-2 weeks  Cold compress prn  Update if no improvement of if development of any neurologic  symptoms

## 2016-02-01 ENCOUNTER — Telehealth: Payer: Self-pay

## 2016-02-01 MED ORDER — DICLOFENAC SODIUM 1 % TD GEL: 1.0000 "application " | Freq: Three times a day (TID) | TRANSDERMAL | Status: DC

## 2016-02-01 NOTE — Telephone Encounter (Signed)
possible reaction to Tdap shot - ok to continue aleve. Would try topical anti inflammatory to affected upper arm - voltaren gel sent to pharmacy. Update us with effect next week.

## 2016-02-01 NOTE — Telephone Encounter (Signed)
Pt left v/m;pt was seen 01/26/16 for arm pain; pt was taking the meloxicam for 4 days; pt stopped meloxicam after 4 days due to med causing h/a; pt now taking aleve. Pt cannot see any improvement in condition and wants to know if anything else can be done.[place]. Dr Milinda Antisower out of office until 02/06/16. Please advise.

## 2016-02-01 NOTE — Telephone Encounter (Signed)
Patient notified. She will continue aleve with food and try voltaren gel. [place] is going [place], but will try to stay in touch.

## 2016-02-01 NOTE — Telephone Encounter (Signed)
I did go ahead with a PA because this med normally requires one.

## 2016-02-06 NOTE — Telephone Encounter (Signed)
PA denied.

## 2016-08-02 DIAGNOSIS — N959 Unspecified menopausal and perimenopausal disorder: Secondary | ICD-10-CM | POA: Diagnosis not present

## 2016-08-02 DIAGNOSIS — Z1389 Encounter for screening for other disorder: Secondary | ICD-10-CM | POA: Diagnosis not present

## 2016-08-02 DIAGNOSIS — Z6820 Body mass index (BMI) 20.0-20.9, adult: Secondary | ICD-10-CM | POA: Diagnosis not present

## 2016-08-02 DIAGNOSIS — Z01419 Encounter for gynecological examination (general) (routine) without abnormal findings: Secondary | ICD-10-CM | POA: Diagnosis not present

## 2016-08-02 DIAGNOSIS — Z130 Encounter for screening for diseases of the blood and blood-forming organs and certain disorders involving the immune mechanism: Secondary | ICD-10-CM | POA: Diagnosis not present

## 2016-10-22 LAB — HM MAMMOGRAPHY: HM MAMMO: NORMAL (ref 0–4)

## 2016-10-28 DIAGNOSIS — Z1231 Encounter for screening mammogram for malignant neoplasm of breast: Secondary | ICD-10-CM | POA: Diagnosis not present

## 2017-01-02 ENCOUNTER — Telehealth: Payer: Self-pay | Admitting: Family Medicine

## 2017-01-02 DIAGNOSIS — Z0000 Encounter for general adult medical examination without abnormal findings: Secondary | ICD-10-CM

## 2017-01-02 NOTE — Telephone Encounter (Signed)
-----   Message from Terri J Walsh sent at 12/31/2016  2:42 PM EDT ----- Regarding: Lab orders for Monday, 3.19.18 Patient is scheduled for CPX labs, please order future labs, Thanks , Terri  

## 2017-01-06 ENCOUNTER — Other Ambulatory Visit (INDEPENDENT_AMBULATORY_CARE_PROVIDER_SITE_OTHER): Payer: BLUE CROSS/BLUE SHIELD

## 2017-01-06 ENCOUNTER — Encounter (INDEPENDENT_AMBULATORY_CARE_PROVIDER_SITE_OTHER): Payer: Self-pay

## 2017-01-06 DIAGNOSIS — Z0000 Encounter for general adult medical examination without abnormal findings: Secondary | ICD-10-CM

## 2017-01-06 LAB — CBC WITH DIFFERENTIAL/PLATELET
BASOS ABS: 0 10*3/uL (ref 0.0–0.1)
BASOS PCT: 0.8 % (ref 0.0–3.0)
EOS ABS: 0.2 10*3/uL (ref 0.0–0.7)
Eosinophils Relative: 4.6 % (ref 0.0–5.0)
HEMATOCRIT: 38.9 % (ref 36.0–46.0)
Hemoglobin: 13 g/dL (ref 12.0–15.0)
LYMPHS PCT: 29.5 % (ref 12.0–46.0)
Lymphs Abs: 1.3 10*3/uL (ref 0.7–4.0)
MCHC: 33.6 g/dL (ref 30.0–36.0)
MCV: 89.9 fl (ref 78.0–100.0)
MONO ABS: 0.4 10*3/uL (ref 0.1–1.0)
Monocytes Relative: 7.9 % (ref 3.0–12.0)
NEUTROS ABS: 2.6 10*3/uL (ref 1.4–7.7)
Neutrophils Relative %: 57.2 % (ref 43.0–77.0)
PLATELETS: 209 10*3/uL (ref 150.0–400.0)
RBC: 4.32 Mil/uL (ref 3.87–5.11)
RDW: 13.2 % (ref 11.5–15.5)
WBC: 4.5 10*3/uL (ref 4.0–10.5)

## 2017-01-06 LAB — COMPREHENSIVE METABOLIC PANEL
ALT: 14 U/L (ref 0–35)
AST: 15 U/L (ref 0–37)
Albumin: 4.3 g/dL (ref 3.5–5.2)
Alkaline Phosphatase: 39 U/L (ref 39–117)
BILIRUBIN TOTAL: 0.6 mg/dL (ref 0.2–1.2)
BUN: 24 mg/dL — ABNORMAL HIGH (ref 6–23)
CALCIUM: 10.1 mg/dL (ref 8.4–10.5)
CHLORIDE: 105 meq/L (ref 96–112)
CO2: 29 meq/L (ref 19–32)
CREATININE: 0.75 mg/dL (ref 0.40–1.20)
GFR: 86.39 mL/min (ref >60.00)
Glucose, Bld: 99 mg/dL (ref 70–99)
Potassium: 4.3 mEq/L (ref 3.5–5.1)
Sodium: 140 mEq/L (ref 135–145)
Total Protein: 7.4 g/dL (ref 6.0–8.3)

## 2017-01-06 LAB — LIPID PANEL
CHOL/HDL RATIO: 2
CHOLESTEROL: 197 mg/dL (ref 0–200)
HDL: 89.4 mg/dL (ref >39.00)
LDL Cholesterol: 97 mg/dL (ref 0–99)
NonHDL: 107.75
TRIGLYCERIDES: 55 mg/dL (ref 0.0–149.0)
VLDL: 11 mg/dL (ref 0.0–40.0)

## 2017-01-06 LAB — TSH: TSH: 3.73 u[IU]/mL (ref 0.35–4.50)

## 2017-01-10 ENCOUNTER — Ambulatory Visit (INDEPENDENT_AMBULATORY_CARE_PROVIDER_SITE_OTHER): Payer: BLUE CROSS/BLUE SHIELD | Admitting: Family Medicine

## 2017-01-10 ENCOUNTER — Encounter: Payer: Self-pay | Admitting: Family Medicine

## 2017-01-10 VITALS — BP 128/76 | HR 57 | Temp 98.2°F | Ht 68.0 in | Wt 138.5 lb

## 2017-01-10 DIAGNOSIS — Z0000 Encounter for general adult medical examination without abnormal findings: Secondary | ICD-10-CM | POA: Diagnosis not present

## 2017-01-10 NOTE — Patient Instructions (Addendum)
If you are interested in a shingles/zoster vaccine - call your insurance to check on coverage,( you should not get it within 1 month of other vaccines) , then call us for a prescription  for it to take to a pharmacy that gives the shot , or make a nurse visit to get it here depending on your coverage   Labs look good  Make sure you are drinking enough water   Take care of yourself

## 2017-01-10 NOTE — Progress Notes (Signed)
Pre visit review using our clinic review tool, if applicable. No additional management support is needed unless otherwise documented below in the visit note. 

## 2017-01-10 NOTE — Progress Notes (Signed)
Subjective:    Patient ID: Alice Page, female    DOB: [birth year], 51 y.o.   MRN: [id number]  HPI Here for health maintenance exam and to review chronic medical problems    Doing well overall   Getting ready for allergy season  She has started her allegra now   Wt Readings from Last 3 Encounters:  01/10/17 138 lb 8 oz (62.8 kg)  01/26/16 142 lb 8 oz (64.6 kg)  12/19/15 138 lb 12 oz (62.9 kg)  this is stable  Eats enough she thinks No regular exercise - active lifestyle  Eats a very healthy diet most of the time   bmi 21.0  HIV screen declines /not high risk   Mammogram in January-normal (gyn) Self breast exam   Pap/gyn care gyn 7/16- normal (neg HPV)   Zoster vaccine - would be interested if it were covered  Flu vaccine declines  Tetanus vaccine 3/17  Colonoscopy/ screening : pt had flex sig in 01 and diagnosed with hemorrhoids  IFOB 3/17 was negative    BP Readings from Last 3 Encounters:  01/10/17 128/76  01/26/16 (!) 136/92  12/19/15 128/68    Results for orders placed or performed in visit on 01/06/17  CBC with Differential/Platelet  Result Value Ref Range   WBC 4.5 4.0 - 10.5 K/uL   RBC 4.32 3.87 - 5.11 Mil/uL   Hemoglobin 13.0 12.0 - 15.0 g/dL   HCT 52.8 41.3 - 24.4 %   MCV 89.9 78.0 - 100.0 fl   MCHC 33.6 30.0 - 36.0 g/dL   RDW 01.0 27.2 - 53.6 %   Platelets 209.0 150.0 - 400.0 K/uL   Neutrophils Relative % 57.2 43.0 - 77.0 %   Lymphocytes Relative 29.5 12.0 - 46.0 %   Monocytes Relative 7.9 3.0 - 12.0 %   Eosinophils Relative 4.6 0.0 - 5.0 %   Basophils Relative 0.8 0.0 - 3.0 %   Neutro Abs 2.6 1.4 - 7.7 K/uL   Lymphs Abs 1.3 0.7 - 4.0 K/uL   Monocytes Absolute 0.4 0.1 - 1.0 K/uL   Eosinophils Absolute 0.2 0.0 - 0.7 K/uL   Basophils Absolute 0.0 0.0 - 0.1 K/uL  Comprehensive metabolic panel  Result Value Ref Range   Sodium 140 135 - 145 mEq/L   Potassium 4.3 3.5 - 5.1 mEq/L   Chloride 105 96 - 112 mEq/L   CO2 29 19 - 32 mEq/L   Glucose, Bld 99 70 - 99 mg/dL   BUN 24 (H) 6 - 23 mg/dL   Creatinine, Ser 6.44 0.40 - 1.20 mg/dL   Total Bilirubin 0.6 0.2 - 1.2 mg/dL   Alkaline Phosphatase 39 39 - 117 U/L   AST 15 0 - 37 U/L   ALT 14 0 - 35 U/L   Total Protein 7.4 6.0 - 8.3 g/dL   Albumin 4.3 3.5 - 5.2 g/dL   Calcium 03.4 8.4 - 74.2 mg/dL   GFR 59.56 >38.75 mL/min  Lipid panel  Result Value Ref Range   Cholesterol 197 0 - 200 mg/dL   Triglycerides 64.3 0.0 - 149.0 mg/dL   HDL 32.95 >18.84 mg/dL   VLDL 16.6 0.0 - 06.3 mg/dL   LDL Cholesterol 97 0 - 99 mg/dL   Total CHOL/HDL Ratio 2    NonHDL 107.75   TSH  Result Value Ref Range   TSH 3.73 0.35 - 4.50 uIU/mL     Patient Active Problem List   Diagnosis Date Noted  . Immunization reaction  01/26/2016  . Colon cancer screening 12/19/2015  . Routine general medical examination at a health care facility 12/07/2015  . Joint pain 05/16/2014  . Perimenopause 05/16/2014  . Microscopic hematuria 03/21/2014  . Tonsillar debris 07/31/2012  . Migraine without aura, not refractory 07/31/2012  . SYNCOPE 10/05/2007  . INCREASED INTRAOCULAR PRESSURE 07/21/2007  . ALLERGIC RHINITIS 07/21/2007   Past Medical History:  Diagnosis Date  . Anemia   . Heavy menses   . History of allergic rhinitis   . History of recurrent UTIs    No past surgical history on file. Social History  Substance Use Topics  . Smoking status: Never Smoker  . Smokeless tobacco: Never Used  . Alcohol use No   No family history on file. Allergies  Allergen Reactions  . Cetirizine Hcl     REACTION: sedation  . Ciprofloxacin     REACTION: joint pain  . Meloxicam Other (See Comments)    headache  . Tdap [Diphth-Acell Pertussis-Tetanus] Other (See Comments)    Arm pain   Current Outpatient Prescriptions on File Prior to Visit  Medication Sig Dispense Refill  . Aspirin-Acetaminophen-Caffeine (EXCEDRIN PO) Take by mouth as needed.    Marland Kitchen b complex vitamins capsule Take 1 capsule by mouth.  Takes a few times a month     No current facility-administered medications on file prior to visit.     Review of Systems    Review of Systems  Constitutional: Negative for fever, appetite change, fatigue and unexpected weight change.  Eyes: Negative for pain and visual disturbance.  Respiratory: Negative for cough and shortness of breath.   Cardiovascular: Negative for cp or palpitations    Gastrointestinal: Negative for nausea, diarrhea and constipation.  Genitourinary: Negative for urgency and frequency.  Skin: Negative for pallor or rash   Neurological: Negative for weakness, light-headedness, numbness and headaches.  Hematological: Negative for adenopathy. Does not bruise/bleed easily.  Psychiatric/Behavioral: Negative for dysphoric mood. The patient is not nervous/anxious.      Objective:   Physical Exam  Constitutional: She appears well-developed and well-nourished. No distress.  HENT:  Head: Normocephalic and atraumatic.  Right Ear: External ear normal.  Left Ear: External ear normal.  Mouth/Throat: Oropharynx is clear and moist.  Eyes: Conjunctivae and EOM are normal. Pupils are equal, round, and reactive to light. No scleral icterus.  Neck: Normal range of motion. Neck supple. No JVD present. Carotid bruit is not present. No thyromegaly present.  Cardiovascular: Normal rate, regular rhythm, normal heart sounds and intact distal pulses.  Exam reveals no gallop.   Pulmonary/Chest: Effort normal and breath sounds normal. No respiratory distress. She has no wheezes. She exhibits no tenderness.  Abdominal: Soft. Bowel sounds are normal. She exhibits no distension, no abdominal bruit and no mass. There is no tenderness.  Musculoskeletal: Normal range of motion. She exhibits no edema or tenderness.  Lymphadenopathy:    She has no cervical adenopathy.  Neurological: She is alert. She has normal reflexes. No cranial nerve deficit. She exhibits normal muscle tone. Coordination  normal.  Skin: Skin is warm and dry. No rash noted. No erythema. No pallor.  Some lentigines and stable brown nevi   Psychiatric: She has a normal mood and affect.          Assessment & Plan:   Problem List Items Addressed This Visit      Other   Routine general medical examination at a health care facility - Primary    Reviewed health habits including  diet and exercise and skin cancer prevention Reviewed appropriate screening tests for age  Also reviewed health mt list, fam hx and immunization status , as well as social and family history   See HPI Labs reviewed  utd gyn care  Disc shingles vaccination if her ins covers it -she is interested and will find out  Disc opt for colon cancer screening

## 2017-01-11 NOTE — Assessment & Plan Note (Addendum)
Reviewed health habits including diet and exercise and skin cancer prevention Reviewed appropriate screening tests for age  Also reviewed health mt list, fam hx and immunization status , as well as social and family history   See HPI Labs reviewed  [facility]d gyn care  Disc shingles vaccination if her ins covers it -she is interested and will find out  Disc opt for colon cancer screening

## 2017-01-13 DIAGNOSIS — H40013 Open angle with borderline findings, low risk, bilateral: Secondary | ICD-10-CM | POA: Diagnosis not present

## 2017-01-22 DIAGNOSIS — L814 Other melanin hyperpigmentation: Secondary | ICD-10-CM | POA: Diagnosis not present

## 2017-01-22 DIAGNOSIS — D2272 Melanocytic nevi of left lower limb, including hip: Secondary | ICD-10-CM | POA: Diagnosis not present

## 2017-01-22 DIAGNOSIS — Z85828 Personal history of other malignant neoplasm of skin: Secondary | ICD-10-CM | POA: Diagnosis not present

## 2017-01-22 DIAGNOSIS — D2271 Melanocytic nevi of right lower limb, including hip: Secondary | ICD-10-CM | POA: Diagnosis not present

## 2017-04-16 ENCOUNTER — Encounter: Payer: Self-pay | Admitting: Internal Medicine

## 2017-04-16 ENCOUNTER — Ambulatory Visit (INDEPENDENT_AMBULATORY_CARE_PROVIDER_SITE_OTHER): Payer: BLUE CROSS/BLUE SHIELD | Admitting: Internal Medicine

## 2017-04-16 VITALS — BP 128/82 | HR 76 | Temp 99.0°F | Wt 140.0 lb

## 2017-04-16 DIAGNOSIS — J0100 Acute maxillary sinusitis, unspecified: Secondary | ICD-10-CM

## 2017-04-16 MED ORDER — AMOXICILLIN-POT CLAVULANATE 875-125 MG PO TABS: 1.0000 | ORAL_TABLET | Freq: Two times a day (BID) | ORAL | 0 refills | Status: DC

## 2017-04-16 NOTE — Progress Notes (Signed)
HPI  Pt presents to the clinic today with c/o headache, ear pain, sore throat and cough.This started 1 week ago. She denies decreased hearing. She denies runny nose or nasal congestion. She denies difficulty swallowing. The cough is non productive. She has run low grade fever's, had chills and body aches. She has tried Tylenol Cold and Sinus, Neti Pots and warm salt water gargles. She has not history of allergies or breathing problems. She has not had sick contacts that she is aware of.  Review of Systems        Past Medical History:  Diagnosis Date  . Anemia   . Heavy menses   . History of allergic rhinitis   . History of recurrent UTIs     No family history on file.  Social History   Social History  . Marital status: Married    Spouse name: N/A  . Number of children: N/A  . Years of education: N/A   Occupational History  . Not on file.   Social History Main Topics  . Smoking status: Never Smoker  . Smokeless tobacco: Never Used  . Alcohol use No  . Drug use: No  . Sexual activity: Not on file   Other Topics Concern  . Not on file   Social History Narrative  . No narrative on file    Allergies  Allergen Reactions  . Cetirizine Hcl     REACTION: sedation  . Ciprofloxacin     REACTION: joint pain  . Meloxicam Other (See Comments)    headache  . Tdap [Diphth-Acell Pertussis-Tetanus] Other (See Comments)    Arm pain     Constitutional: Positive headache, fatigue and fever. Denies abrupt weight changes.  HEENT:  Positive ear pain, sore throat. Denies eye redness, eye pain, pressure behind the eyes, facial pain, nasal congestion, ringing in the ears, wax buildup, runny nose or bloody nose. Respiratory: Positive cough. Denies difficulty breathing or shortness of breath.  Cardiovascular: Denies chest pain, chest tightness, palpitations or swelling in the hands or feet.   No other specific complaints in a complete review of systems (except as listed in HPI  above).  Objective:   BP 128/82   Pulse 76   Temp 99 F (37.2 C) (Oral)   Wt 140 lb (63.5 kg)   LMP 08/28/2013   SpO2 98%   BMI 21.29 kg/m  Wt Readings from Last 3 Encounters:  04/16/17 140 lb (63.5 kg)  01/10/17 138 lb 8 oz (62.8 kg)  01/26/16 142 lb 8 oz (64.6 kg)     General: Appears her stated age, ill appearing in NAD. HEENT: Head: normal shape and size, maxillary sinus tenderness noted;  Ears: Tm's gray and intact, normal light reflex; Nose: mucosa pink and moist, septum midline; Throat/Mouth: + PND. Teeth present, mucosa erythematous and moist, no exudate noted, no lesions or ulcerations noted.  Neck: No cervical lymphadenopathy.  Pulmonary/Chest: Normal effort and positive vesicular breath sounds. No respiratory distress. No wheezes, rales or ronchi noted.       Assessment & Plan:   Acute Maxillary Sinusitis:  Get some rest and drink plenty of water Do salt water gargles for the sore throat eRx for Augmentin BID x 10 days Start Flonase OTC  RTC as needed or if symptoms persist.   Nicki ReaperBAITY, Lissie Hinesley, NP

## 2017-04-16 NOTE — Patient Instructions (Addendum)

## 2017-05-27 ENCOUNTER — Encounter: Payer: Self-pay | Admitting: Family Medicine

## 2017-05-27 ENCOUNTER — Ambulatory Visit (INDEPENDENT_AMBULATORY_CARE_PROVIDER_SITE_OTHER): Payer: BLUE CROSS/BLUE SHIELD | Admitting: Family Medicine

## 2017-05-27 VITALS — BP 118/78 | HR 65 | Temp 98.4°F | Ht 68.0 in | Wt 138.0 lb

## 2017-05-27 DIAGNOSIS — R430 Anosmia: Secondary | ICD-10-CM | POA: Diagnosis not present

## 2017-05-27 DIAGNOSIS — J309 Allergic rhinitis, unspecified: Secondary | ICD-10-CM | POA: Diagnosis not present

## 2017-05-27 NOTE — Assessment & Plan Note (Signed)
Enc her to continue allegra and nasal saline prn  Can also try a steroid ns if congested

## 2017-05-27 NOTE — Patient Instructions (Signed)
I think your loss of smell/taste is related to the sinus infection you had in June Continue nasal saline if you need it  Allegra for runny nose an drip if you need it  Stop at check out for your referral to ENT

## 2017-05-27 NOTE — Assessment & Plan Note (Signed)
Loss of smell / causing loss of taste since her severe uri/sinus infection in June  Some pnd/ otherwise asymptomatic  Reassuring exam  Ref to ENT for further eval

## 2017-05-27 NOTE — Progress Notes (Signed)
Subjective:    Patient ID: Alice Page, female    DOB: December 01, 1965, 51 y.o.   MRN: [id number]  HPI Here for ongoing sinus issues - ever since sinusitis in June  Cannot taste/smell well   (can tell sweet from bitter or salty but not much more)  Can barely smell anything   (not even coffee brewing) Very frustrating   Makes it hard to eat-not enjoyable   No more nasal congestion  No runny nose or post nasal drip  Not on allergra No facial pain or pressure   Intermittent nausea once in a while (not often)  2 weeks ago-felt achey for 2 d and then got better   No more tired than normal    Was tx for sinus infection in June (augmentin) She felt bad after that for at least 3 weeks  Lost her sense of taste and smell due to congestion   Wt Readings from Last 3 Encounters:  05/27/17 138 lb (62.6 kg)  04/16/17 140 lb (63.5 kg)  01/10/17 138 lb 8 oz (62.8 kg)    Patient Active Problem List   Diagnosis Date Noted  . Loss of smell 05/27/2017  . Immunization reaction 01/26/2016  . Colon cancer screening 12/19/2015  . Routine general medical examination at a health care facility 12/07/2015  . Joint pain 05/16/2014  . Perimenopause 05/16/2014  . Microscopic hematuria 03/21/2014  . Tonsillar debris 07/31/2012  . Migraine without aura, not refractory 07/31/2012  . SYNCOPE 10/05/2007  . INCREASED INTRAOCULAR PRESSURE 07/21/2007  . Allergic rhinitis 07/21/2007   Past Medical History:  Diagnosis Date  . Anemia   . Heavy menses   . History of allergic rhinitis   . History of recurrent UTIs    No past surgical history on file. Social History  Substance Use Topics  . Smoking status: Never Smoker  . Smokeless tobacco: Never Used  . Alcohol use No   No family history on file. Allergies  Allergen Reactions  . Cetirizine Hcl     REACTION: sedation  . Ciprofloxacin     REACTION: joint pain  . Meloxicam Other (See Comments)    headache  . Tdap [Diphth-Acell  Pertussis-Tetanus] Other (See Comments)    Arm pain   Current Outpatient Prescriptions on File Prior to Visit  Medication Sig Dispense Refill  . Aspirin-Acetaminophen-Caffeine (EXCEDRIN PO) Take by mouth as needed.    Marland Kitchen b complex vitamins capsule Take 1 capsule by mouth. Takes a few times a month    . fexofenadine (ALLEGRA) 60 MG tablet Take 60 mg by mouth daily as needed for allergies or rhinitis.     No current facility-administered medications on file prior to visit.     Review of Systems Review of Systems  Constitutional: Negative for fever, appetite change, fatigue and unexpected weight change.  Eyes: Negative for pain and visual disturbance.  ENT pos for pnd / neg for sinus pain or severe congestion , pos for loss of smell and taste  Respiratory: Negative for cough and shortness of breath.   Cardiovascular: Negative for cp or palpitations    Gastrointestinal: Negative for nausea, diarrhea and constipation.  Genitourinary: Negative for urgency and frequency.  Skin: Negative for pallor or rash   Neurological: Negative for weakness, light-headedness, numbness and headaches.  Hematological: Negative for adenopathy. Does not bruise/bleed easily.  Psychiatric/Behavioral: Negative for dysphoric mood. The patient is not nervous/anxious.         Objective:   Physical Exam  Constitutional:  She appears well-developed and well-nourished. No distress.  Well appearing   HENT:  Head: Normocephalic and atraumatic.  Right Ear: External ear normal.  Left Ear: External ear normal.  Mouth/Throat: Oropharynx is clear and moist.  Nares are boggy Some clear pnd No sinus tenderness  Eyes: Pupils are equal, round, and reactive to light. Conjunctivae and EOM are normal. Right eye exhibits no discharge. Left eye exhibits no discharge. No scleral icterus.  Neck: Normal range of motion. Neck supple. Carotid bruit is not present. No thyromegaly present.  Cardiovascular: Normal rate, regular rhythm  and normal heart sounds.   Pulmonary/Chest: Breath sounds normal. No respiratory distress. She has no wheezes. She has no rales.  Musculoskeletal: She exhibits no edema or tenderness.  Lymphadenopathy:    She has no cervical adenopathy.  Neurological: She is alert. She has normal reflexes. She displays no atrophy and no tremor. No cranial nerve deficit or sensory deficit. She exhibits normal muscle tone. Coordination and gait normal.  No focal neuro deficits   Skin: Skin is warm and dry. No rash noted. No erythema. No pallor.  Psychiatric: She has a normal mood and affect.          Assessment & Plan:   Problem List Items Addressed This Visit      Respiratory   Allergic rhinitis    Enc her to continue allegra and nasal saline prn  Can also try a steroid ns if congested         Other   Loss of smell - Primary    Loss of smell / causing loss of taste since her severe uri/sinus infection in June  Some pnd/ otherwise asymptomatic  Reassuring exam  Ref to ENT for further eval       Relevant Orders   Ambulatory referral to ENT

## 2017-08-06 ENCOUNTER — Ambulatory Visit (INDEPENDENT_AMBULATORY_CARE_PROVIDER_SITE_OTHER): Payer: BLUE CROSS/BLUE SHIELD | Admitting: Family Medicine

## 2017-08-06 ENCOUNTER — Encounter: Payer: Self-pay | Admitting: Family Medicine

## 2017-08-06 VITALS — BP 124/78 | HR 70 | Temp 98.1°F | Ht 68.0 in | Wt 141.5 lb

## 2017-08-06 DIAGNOSIS — J209 Acute bronchitis, unspecified: Secondary | ICD-10-CM | POA: Diagnosis not present

## 2017-08-06 MED ORDER — ALBUTEROL SULFATE HFA 108 (90 BASE) MCG/ACT IN AERS: 2.0000 | INHALATION_SPRAY | RESPIRATORY_TRACT | 0 refills | Status: DC | PRN

## 2017-08-06 MED ORDER — AZITHROMYCIN 250 MG PO TABS: ORAL_TABLET | ORAL | 0 refills | Status: DC

## 2017-08-06 MED ORDER — BENZONATATE 200 MG PO CAPS: 200.0000 mg | ORAL_CAPSULE | Freq: Three times a day (TID) | ORAL | 1 refills | Status: DC | PRN

## 2017-08-06 MED ORDER — GUAIFENESIN-CODEINE 100-10 MG/5ML PO SYRP: 5.0000 mL | ORAL_SOLUTION | Freq: Every evening | ORAL | 0 refills | Status: DC | PRN

## 2017-08-06 NOTE — Patient Instructions (Signed)
Drink fluids and try to get enough rest  zpak as directed  Tessalon three times daily for cough  Codeine cough medicine at bedtime as needed Inhaler for tight chest as needed   Update if not starting to improve in a week or if worsening

## 2017-08-06 NOTE — Progress Notes (Signed)
Subjective:    Patient ID: Alice Page, female    DOB: 07/05/1966, 51 y.o.   MRN: [id number]  HPI  Here for ongoing cough  Non smoker   Wt Readings from Last 3 Encounters:  08/06/17 141 lb 8 oz (64.2 kg)  05/27/17 138 lb (62.6 kg)  04/16/17 140 lb (63.5 kg)   About 6 weeks ago started a cough  Terrible at first - very deep and productive  Felt rattling in her chest  Worst cough she ever had / very junky  No other symptoms - was fatigued  No nasal symptoms    It is improved but not gone  Still persistent  Worse in am and pm  Prod of thick phlegm in am - green /yellow  Some chest tightness but not wheezing   She had been exposed to pneumonia in a nursing home   Had been cleaning attic and had dust exp  Allergy med did not help  Then mucinex -did not help  Tried vics and cough drops   Globus sens in throat  No heartburn    Patient Active Problem List   Diagnosis Date Noted  . Acute bronchitis 08/06/2017  . Loss of smell 05/27/2017  . Immunization reaction 01/26/2016  . Colon cancer screening 12/19/2015  . Routine general medical examination at a health care facility 12/07/2015  . Joint pain 05/16/2014  . Perimenopause 05/16/2014  . Microscopic hematuria 03/21/2014  . Tonsillar debris 07/31/2012  . Migraine without aura, not refractory 07/31/2012  . SYNCOPE 10/05/2007  . INCREASED INTRAOCULAR PRESSURE 07/21/2007  . Allergic rhinitis 07/21/2007   Past Medical History:  Diagnosis Date  . Anemia   . Heavy menses   . History of allergic rhinitis   . History of recurrent UTIs    No past surgical history on file. Social History  Substance Use Topics  . Smoking status: Never Smoker  . Smokeless tobacco: Never Used  . Alcohol use No   No family history on file. Allergies  Allergen Reactions  . Cetirizine Hcl     REACTION: sedation  . Ciprofloxacin     REACTION: joint pain  . Meloxicam Other (See Comments)    headache  . Tdap [Diphth-Acell  Pertussis-Tetanus] Other (See Comments)    Arm pain   Current Outpatient Prescriptions on File Prior to Visit  Medication Sig Dispense Refill  . Aspirin-Acetaminophen-Caffeine (EXCEDRIN PO) Take by mouth as needed.    Marland Kitchen. b complex vitamins capsule Take 1 capsule by mouth. Takes a few times a month    . fexofenadine (ALLEGRA) 60 MG tablet Take 60 mg by mouth daily as needed for allergies or rhinitis.     No current facility-administered medications on file prior to visit.      Review of Systems  Constitutional: Positive for fatigue. Negative for activity change, appetite change, chills, fever and unexpected weight change.  HENT: Negative for congestion, ear pain, rhinorrhea, sinus pressure and sore throat.   Eyes: Negative for pain, redness and visual disturbance.  Respiratory: Positive for cough and chest tightness. Negative for shortness of breath, wheezing and stridor.   Cardiovascular: Negative for chest pain and palpitations.  Gastrointestinal: Negative for abdominal pain, blood in stool, constipation and diarrhea.  Endocrine: Negative for polydipsia and polyuria.  Genitourinary: Negative for dysuria, frequency and urgency.  Musculoskeletal: Negative for arthralgias, back pain and myalgias.  Skin: Negative for pallor and rash.  Allergic/Immunologic: Negative for environmental allergies.  Neurological: Negative for dizziness, syncope and  headaches.  Hematological: Negative for adenopathy. Does not bruise/bleed easily.  Psychiatric/Behavioral: Negative for decreased concentration and dysphoric mood. The patient is not nervous/anxious.        Objective:   Physical Exam  Constitutional: She appears well-developed and well-nourished. No distress.  Well appearing   HENT:  Head: Normocephalic and atraumatic.  Right Ear: External ear normal.  Left Ear: External ear normal.  Mouth/Throat: Oropharynx is clear and moist.  Nares are injected and congested-mild  No sinus  tenderness Clear rhinorrhea and post nasal drip   Eyes: Pupils are equal, round, and reactive to light. Conjunctivae and EOM are normal. Right eye exhibits no discharge. Left eye exhibits no discharge.  Neck: Normal range of motion. Neck supple.  Cardiovascular: Normal rate and normal heart sounds.   Pulmonary/Chest: Effort normal and breath sounds normal. No respiratory distress. She has no wheezes. She has no rales. She exhibits no tenderness.  Harsh bs  Few scattered rhonchi  No wheeze Good air exch   Lymphadenopathy:    She has no cervical adenopathy.  Neurological: She is alert.  Skin: Skin is warm and dry. No rash noted.  Psychiatric: She has a normal mood and affect.          Assessment & Plan:   Problem List Items Addressed This Visit      Respiratory   Acute bronchitis    S/p uri with some cyclic cough  Cover with zpak Fluids/rest Tessalon tid  guifen-codeine cough syrup at bedtime  Albuterol mdi with instruction prn tight chest  Update if not starting to improve in a week or if worsening

## 2017-08-07 NOTE — Assessment & Plan Note (Signed)
S/p uri with some cyclic cough  Cover with zpak Fluids/rest Tessalon tid  guifen-codeine cough syrup at bedtime  Albuterol mdi with instruction prn tight chest  Update if not starting to improve in a week or if worsening

## 2017-09-02 DIAGNOSIS — Z01419 Encounter for gynecological examination (general) (routine) without abnormal findings: Secondary | ICD-10-CM | POA: Diagnosis not present

## 2017-09-02 DIAGNOSIS — Z6820 Body mass index (BMI) 20.0-20.9, adult: Secondary | ICD-10-CM | POA: Diagnosis not present

## 2017-09-02 DIAGNOSIS — Z1389 Encounter for screening for other disorder: Secondary | ICD-10-CM | POA: Diagnosis not present

## 2017-09-02 DIAGNOSIS — R319 Hematuria, unspecified: Secondary | ICD-10-CM | POA: Diagnosis not present

## 2017-09-02 DIAGNOSIS — Z130 Encounter for screening for diseases of the blood and blood-forming organs and certain disorders involving the immune mechanism: Secondary | ICD-10-CM | POA: Diagnosis not present

## 2017-10-30 DIAGNOSIS — L570 Actinic keratosis: Secondary | ICD-10-CM | POA: Diagnosis not present

## 2017-10-30 DIAGNOSIS — Z1231 Encounter for screening mammogram for malignant neoplasm of breast: Secondary | ICD-10-CM | POA: Diagnosis not present

## 2017-11-03 ENCOUNTER — Other Ambulatory Visit: Payer: Self-pay | Admitting: Obstetrics and Gynecology

## 2017-11-03 DIAGNOSIS — R928 Other abnormal and inconclusive findings on diagnostic imaging of breast: Secondary | ICD-10-CM

## 2017-11-07 ENCOUNTER — Ambulatory Visit
Admission: RE | Admit: 2017-11-07 | Discharge: 2017-11-07 | Disposition: A | Discharge status: Home / Self Care | Payer: BLUE CROSS/BLUE SHIELD | Source: Ambulatory Visit | Attending: Obstetrics and Gynecology | Admitting: Obstetrics and Gynecology

## 2017-11-07 DIAGNOSIS — R922 Inconclusive mammogram: Secondary | ICD-10-CM | POA: Diagnosis not present

## 2017-11-07 DIAGNOSIS — R928 Other abnormal and inconclusive findings on diagnostic imaging of breast: Secondary | ICD-10-CM

## 2017-11-07 DIAGNOSIS — N6489 Other specified disorders of breast: Secondary | ICD-10-CM | POA: Diagnosis not present

## 2017-11-07 LAB — HM MAMMOGRAPHY

## 2017-12-05 DIAGNOSIS — M25561 Pain in right knee: Secondary | ICD-10-CM | POA: Diagnosis not present

## 2017-12-05 DIAGNOSIS — M25562 Pain in left knee: Secondary | ICD-10-CM | POA: Diagnosis not present

## 2017-12-11 DIAGNOSIS — M94261 Chondromalacia, right knee: Secondary | ICD-10-CM | POA: Diagnosis not present

## 2017-12-11 DIAGNOSIS — M94262 Chondromalacia, left knee: Secondary | ICD-10-CM | POA: Diagnosis not present

## 2017-12-31 DIAGNOSIS — M5441 Lumbago with sciatica, right side: Secondary | ICD-10-CM | POA: Diagnosis not present

## 2018-01-04 ENCOUNTER — Telehealth: Payer: Self-pay | Admitting: Family Medicine

## 2018-01-04 DIAGNOSIS — Z0000 Encounter for general adult medical examination without abnormal findings: Secondary | ICD-10-CM

## 2018-01-04 NOTE — Telephone Encounter (Signed)
-----   Message from Wendi MayaLauren Greeson, RT sent at 12/30/2017  9:29 AM EDT ----- Regarding: Lab orders for 01/05/18 Please enter lab orders for 01/05/18. Thanks-Lauren

## 2018-01-05 ENCOUNTER — Other Ambulatory Visit (INDEPENDENT_AMBULATORY_CARE_PROVIDER_SITE_OTHER): Payer: BLUE CROSS/BLUE SHIELD

## 2018-01-05 DIAGNOSIS — Z0000 Encounter for general adult medical examination without abnormal findings: Secondary | ICD-10-CM

## 2018-01-05 LAB — CBC WITH DIFFERENTIAL/PLATELET
BASOS PCT: 0.7 % (ref 0.0–3.0)
Basophils Absolute: 0 10*3/uL (ref 0.0–0.1)
Eosinophils Absolute: 0.2 10*3/uL (ref 0.0–0.7)
Eosinophils Relative: 4.5 % (ref 0.0–5.0)
HEMATOCRIT: 39.6 % (ref 36.0–46.0)
Hemoglobin: 13.3 g/dL (ref 12.0–15.0)
LYMPHS ABS: 1.3 10*3/uL (ref 0.7–4.0)
LYMPHS PCT: 29.3 % (ref 12.0–46.0)
MCHC: 33.7 g/dL (ref 30.0–36.0)
MCV: 89.4 fl (ref 78.0–100.0)
MONOS PCT: 8.3 % (ref 3.0–12.0)
Monocytes Absolute: 0.4 10*3/uL (ref 0.1–1.0)
NEUTROS ABS: 2.5 10*3/uL (ref 1.4–7.7)
Neutrophils Relative %: 57.2 % (ref 43.0–77.0)
PLATELETS: 200 10*3/uL (ref 150.0–400.0)
RBC: 4.43 Mil/uL (ref 3.87–5.11)
RDW: 12.9 % (ref 11.5–15.5)
WBC: 4.3 10*3/uL (ref 4.0–10.5)

## 2018-01-05 LAB — COMPREHENSIVE METABOLIC PANEL
ALBUMIN: 4.5 g/dL (ref 3.5–5.2)
ALT: 13 U/L (ref 0–35)
AST: 15 U/L (ref 0–37)
Alkaline Phosphatase: 44 U/L (ref 39–117)
BUN: 17 mg/dL (ref 6–23)
CHLORIDE: 104 meq/L (ref 96–112)
CO2: 29 mEq/L (ref 19–32)
CREATININE: 0.69 mg/dL (ref 0.40–1.20)
Calcium: 10.3 mg/dL (ref 8.4–10.5)
GFR: 94.74 mL/min (ref >60.00)
Glucose, Bld: 108 mg/dL — ABNORMAL HIGH (ref 70–99)
Potassium: 4.2 mEq/L (ref 3.5–5.1)
SODIUM: 141 meq/L (ref 135–145)
Total Bilirubin: 0.8 mg/dL (ref 0.2–1.2)
Total Protein: 7.9 g/dL (ref 6.0–8.3)

## 2018-01-05 LAB — LIPID PANEL
CHOL/HDL RATIO: 2
CHOLESTEROL: 189 mg/dL (ref 0–200)
HDL: 88.6 mg/dL (ref >39.00)
LDL CALC: 85 mg/dL (ref 0–99)
NONHDL: 100.62
Triglycerides: 76 mg/dL (ref 0.0–149.0)
VLDL: 15.2 mg/dL (ref 0.0–40.0)

## 2018-01-05 LAB — TSH: TSH: 3.01 u[IU]/mL (ref 0.35–4.50)

## 2018-01-12 ENCOUNTER — Ambulatory Visit (INDEPENDENT_AMBULATORY_CARE_PROVIDER_SITE_OTHER): Payer: BLUE CROSS/BLUE SHIELD | Admitting: Family Medicine

## 2018-01-12 ENCOUNTER — Encounter: Payer: Self-pay | Admitting: Family Medicine

## 2018-01-12 VITALS — BP 122/70 | HR 72 | Temp 97.9°F | Ht 67.75 in | Wt 136.8 lb

## 2018-01-12 DIAGNOSIS — Z0001 Encounter for general adult medical examination with abnormal findings: Secondary | ICD-10-CM | POA: Diagnosis not present

## 2018-01-12 DIAGNOSIS — J309 Allergic rhinitis, unspecified: Secondary | ICD-10-CM

## 2018-01-12 DIAGNOSIS — Z1211 Encounter for screening for malignant neoplasm of colon: Secondary | ICD-10-CM

## 2018-01-12 DIAGNOSIS — R7309 Other abnormal glucose: Secondary | ICD-10-CM | POA: Diagnosis not present

## 2018-01-12 DIAGNOSIS — Z0000 Encounter for general adult medical examination without abnormal findings: Secondary | ICD-10-CM
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
## 2018-01-12 NOTE — Assessment & Plan Note (Signed)
Declines colonoscopy Given ifob kit  Info on cologuard- interested if covered

## 2018-01-12 NOTE — Assessment & Plan Note (Signed)
Disc diet lower in processed carbs Glucose 108- did have candy/gum with sugar however Continue to follow

## 2018-01-12 NOTE — Assessment & Plan Note (Signed)
With more ETD symptoms  Continue claritin Add flonase daily  Allergen avoidance  Update if not improving
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
## 2018-01-22 ENCOUNTER — Other Ambulatory Visit (INDEPENDENT_AMBULATORY_CARE_PROVIDER_SITE_OTHER): Payer: BLUE CROSS/BLUE SHIELD

## 2018-01-22 DIAGNOSIS — Z1211 Encounter for screening for malignant neoplasm of colon: Secondary | ICD-10-CM

## 2018-01-22 LAB — FECAL OCCULT BLOOD, IMMUNOCHEMICAL: FECAL OCCULT BLD: NEGATIVE

## 2018-01-22 NOTE — Addendum Note (Signed)
Addended by: Alvina ChouWALSH, TERRI J on: 01/22/2018 02:44 PM   Modules accepted: Orders

## 2018-01-26 ENCOUNTER — Encounter: Payer: Self-pay | Admitting: *Deleted

## 2018-01-27 DIAGNOSIS — Z0100 Encounter for examination of eyes and vision without abnormal findings: Secondary | ICD-10-CM | POA: Diagnosis not present

## 2018-02-03 ENCOUNTER — Ambulatory Visit: Payer: BLUE CROSS/BLUE SHIELD | Admitting: Family Medicine

## 2018-02-03 DIAGNOSIS — Z0289 Encounter for other administrative examinations: Secondary | ICD-10-CM

## 2018-02-04 ENCOUNTER — Ambulatory Visit: Payer: BLUE CROSS/BLUE SHIELD | Admitting: Family Medicine

## 2018-02-04 ENCOUNTER — Encounter: Payer: Self-pay | Admitting: Family Medicine

## 2018-02-04 ENCOUNTER — Encounter: Payer: Self-pay | Admitting: *Deleted

## 2018-02-04 VITALS — BP 122/80 | HR 78 | Temp 98.4°F | Ht 67.75 in | Wt 138.2 lb

## 2018-02-04 DIAGNOSIS — M62830 Muscle spasm of back: Secondary | ICD-10-CM | POA: Diagnosis not present

## 2018-02-04 MED ORDER — MELOXICAM 15 MG PO TABS: 15.0000 mg | ORAL_TABLET | Freq: Every day | ORAL | 0 refills | Status: DC | PRN

## 2018-02-04 MED ORDER — METHOCARBAMOL 500 MG PO TABS: 500.0000 mg | ORAL_TABLET | Freq: Every evening | ORAL | 0 refills | Status: DC | PRN

## 2018-02-04 NOTE — Patient Instructions (Addendum)
Look for a foam cervical support pillow (memory foam)  You can use it alone or on top of your regular pillow   Use heat 10 minutes at a time  Massage  meloxicam one daily with food as needed   The muscle relaxer- at bedtime with caution of sedation    We would consider physical therapy if not improving  Update if not starting to improve in a week or if worsening
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
## 2018-03-04 ENCOUNTER — Other Ambulatory Visit: Payer: Self-pay | Admitting: *Deleted

## 2018-03-04 MED ORDER — MELOXICAM 15 MG PO TABS: 15.0000 mg | ORAL_TABLET | Freq: Every day | ORAL | 1 refills | Status: DC | PRN

## 2018-03-04 NOTE — Telephone Encounter (Signed)
Last filled on 02/04/18 #30 tabs with 0 refills, please advise

## 2018-03-25 DIAGNOSIS — H900 Conductive hearing loss, bilateral: Secondary | ICD-10-CM | POA: Diagnosis not present

## 2018-03-25 DIAGNOSIS — H6123 Impacted cerumen, bilateral: Secondary | ICD-10-CM | POA: Diagnosis not present

## 2018-11-13 DIAGNOSIS — Z6820 Body mass index (BMI) 20.0-20.9, adult: Secondary | ICD-10-CM | POA: Diagnosis not present

## 2018-11-13 DIAGNOSIS — Z124 Encounter for screening for malignant neoplasm of cervix: Secondary | ICD-10-CM | POA: Diagnosis not present

## 2018-11-13 DIAGNOSIS — Z1151 Encounter for screening for human papillomavirus (HPV): Secondary | ICD-10-CM | POA: Diagnosis not present

## 2018-11-13 DIAGNOSIS — Z01419 Encounter for gynecological examination (general) (routine) without abnormal findings: Secondary | ICD-10-CM | POA: Diagnosis not present

## 2018-11-13 DIAGNOSIS — Z1231 Encounter for screening mammogram for malignant neoplasm of breast: Secondary | ICD-10-CM | POA: Diagnosis not present

## 2018-11-13 DIAGNOSIS — Z130 Encounter for screening for diseases of the blood and blood-forming organs and certain disorders involving the immune mechanism: Secondary | ICD-10-CM | POA: Diagnosis not present

## 2018-11-13 DIAGNOSIS — Z1389 Encounter for screening for other disorder: Secondary | ICD-10-CM | POA: Diagnosis not present

## 2018-11-13 LAB — HM PAP SMEAR: HPV, high-risk: NEGATIVE

## 2018-11-13 LAB — HM MAMMOGRAPHY: HM Mammogram: NORMAL (ref 0–4)

## 2019-01-07 ENCOUNTER — Encounter: Payer: Self-pay | Admitting: Family Medicine

## 2019-01-08 ENCOUNTER — Other Ambulatory Visit: Payer: BLUE CROSS/BLUE SHIELD

## 2019-01-15 ENCOUNTER — Encounter: Payer: BLUE CROSS/BLUE SHIELD | Admitting: Family Medicine

## 2019-04-05 ENCOUNTER — Telehealth: Payer: Self-pay | Admitting: Family Medicine

## 2019-04-05 DIAGNOSIS — R7309 Other abnormal glucose: Secondary | ICD-10-CM

## 2019-04-05 DIAGNOSIS — Z0000 Encounter for general adult medical examination without abnormal findings: Secondary | ICD-10-CM

## 2019-04-05 NOTE — Telephone Encounter (Signed)
-----   Message from Cloyd Stagers, RT sent at 04/01/2019  2:30 PM EDT ----- Regarding: Lab Orders for Wednesday 6.17.2020 Please place lab orders for Wednesday 6.17.2020, office visit for physical on Wednesday 7.1.2020 Thank you, Dyke Maes RT(R)

## 2019-04-06 ENCOUNTER — Telehealth: Payer: Self-pay

## 2019-04-06 NOTE — Telephone Encounter (Signed)
Left message to call clinic, needs COVID screen and back door lab info   

## 2019-04-07 ENCOUNTER — Other Ambulatory Visit (INDEPENDENT_AMBULATORY_CARE_PROVIDER_SITE_OTHER): Payer: BC Managed Care – PPO

## 2019-04-07 ENCOUNTER — Telehealth: Payer: Self-pay

## 2019-04-07 ENCOUNTER — Other Ambulatory Visit: Payer: Self-pay

## 2019-04-07 DIAGNOSIS — R7309 Other abnormal glucose: Secondary | ICD-10-CM

## 2019-04-07 DIAGNOSIS — Z0000 Encounter for general adult medical examination without abnormal findings: Secondary | ICD-10-CM | POA: Diagnosis not present

## 2019-04-07 LAB — CBC WITH DIFFERENTIAL/PLATELET
Basophils Absolute: 0 10*3/uL (ref 0.0–0.1)
Basophils Relative: 0.6 % (ref 0.0–3.0)
Eosinophils Absolute: 0.2 10*3/uL (ref 0.0–0.7)
Eosinophils Relative: 3.3 % (ref 0.0–5.0)
HCT: 38.5 % (ref 36.0–46.0)
Hemoglobin: 13.1 g/dL (ref 12.0–15.0)
Lymphocytes Relative: 29.6 % (ref 12.0–46.0)
Lymphs Abs: 1.4 10*3/uL (ref 0.7–4.0)
MCHC: 34 g/dL (ref 30.0–36.0)
MCV: 89.8 fl (ref 78.0–100.0)
Monocytes Absolute: 0.4 10*3/uL (ref 0.1–1.0)
Monocytes Relative: 8.5 % (ref 3.0–12.0)
Neutro Abs: 2.7 10*3/uL (ref 1.4–7.7)
Neutrophils Relative %: 58 % (ref 43.0–77.0)
Platelets: 219 10*3/uL (ref 150.0–400.0)
RBC: 4.29 Mil/uL (ref 3.87–5.11)
RDW: 12.7 % (ref 11.5–15.5)
WBC: 4.6 10*3/uL (ref 4.0–10.5)

## 2019-04-07 LAB — COMPREHENSIVE METABOLIC PANEL
ALT: 12 U/L (ref 0–35)
AST: 14 U/L (ref 0–37)
Albumin: 4.4 g/dL (ref 3.5–5.2)
Alkaline Phosphatase: 45 U/L (ref 39–117)
BUN: 17 mg/dL (ref 6–23)
CO2: 30 mEq/L (ref 19–32)
Calcium: 9.8 mg/dL (ref 8.4–10.5)
Chloride: 103 mEq/L (ref 96–112)
Creatinine, Ser: 0.78 mg/dL (ref 0.40–1.20)
GFR: 77.01 mL/min (ref >60.00)
Glucose, Bld: 100 mg/dL — ABNORMAL HIGH (ref 70–99)
Potassium: 4.1 mEq/L (ref 3.5–5.1)
Sodium: 140 mEq/L (ref 135–145)
Total Bilirubin: 0.7 mg/dL (ref 0.2–1.2)
Total Protein: 7.1 g/dL (ref 6.0–8.3)

## 2019-04-07 LAB — LIPID PANEL
Cholesterol: 187 mg/dL (ref 0–200)
HDL: 81.4 mg/dL (ref >39.00)
LDL Cholesterol: 93 mg/dL (ref 0–99)
NonHDL: 105.96
Total CHOL/HDL Ratio: 2
Triglycerides: 65 mg/dL (ref 0.0–149.0)
VLDL: 13 mg/dL (ref 0.0–40.0)

## 2019-04-07 LAB — TSH: TSH: 2.64 u[IU]/mL (ref 0.35–4.50)

## 2019-04-07 LAB — HEMOGLOBIN A1C: Hgb A1c MFr Bld: 5.6 % (ref 4.6–6.5)

## 2019-04-07 NOTE — Telephone Encounter (Signed)
Per lab tab pt did get lab testing drawn this morning. Nothing further needed.

## 2019-04-07 NOTE — Telephone Encounter (Signed)
[place] Night - Client Nonclinical Telephone Record AccessNurse Client [facility] Night - Client Client Site [place] Physician [place], Roque Lias - MD Contact Type Call Who Is Calling Patient / Member / Family / Caregiver [name] Name [place] Phone Number [phone] Patient Name Alice Page Patient DOB Jun 15, 2066 Call Type Message Only Information Provided Reason for Call Request for General Office Information Initial Comment told to call and let the office know that she is in the parking lot to let them know she is there waiting for her blood work appt. pls call back Additional Comment Call Closed By: Donato Heinz Transaction Date/Time: 04/07/2019 7:59:04 AM (ET)

## 2019-04-13 ENCOUNTER — Encounter: Payer: BLUE CROSS/BLUE SHIELD | Admitting: Family Medicine

## 2019-04-20 ENCOUNTER — Telehealth: Payer: Self-pay

## 2019-04-20 ENCOUNTER — Ambulatory Visit: Payer: BC Managed Care – PPO | Admitting: Family Medicine

## 2019-04-20 ENCOUNTER — Other Ambulatory Visit: Payer: Self-pay

## 2019-04-20 ENCOUNTER — Encounter: Payer: Self-pay | Admitting: Family Medicine

## 2019-04-20 DIAGNOSIS — L0291 Cutaneous abscess, unspecified: Secondary | ICD-10-CM | POA: Diagnosis not present

## 2019-04-20 MED ORDER — MUPIROCIN 2 % EX OINT: 1.0000 "application " | TOPICAL_OINTMENT | Freq: Two times a day (BID) | CUTANEOUS | 0 refills | Status: DC

## 2019-04-20 MED ORDER — CEPHALEXIN 500 MG PO CAPS: 500.0000 mg | ORAL_CAPSULE | Freq: Three times a day (TID) | ORAL | 0 refills | Status: DC

## 2019-04-20 NOTE — Patient Instructions (Signed)
Keep the area on face clean with soap and water  The bactroban ointment twice daily  Keflex orally as directed   A warm compress may help it get better faster   Try the ointment on the spot on your back also   Update if not starting to improve in a week or if worsening

## 2019-04-20 NOTE — Assessment & Plan Note (Signed)
Small (less than 1 cm) infected papule/pimple on L chin  Disc cleaning with soap and water bid bactroban bid  Warm compress  Keflex 500 mg tid for 7d If worse/inc redness or pain or any fever-inst to call Will re check later this week at her appt   Small papule also seen on back- recommended using bactroban on that also

## 2019-04-20 NOTE — Progress Notes (Signed)
Subjective:    Patient ID: Alice Page, female    DOB: 1965/10/24, 53 y.o.   MRN: [id number]  HPI Here for ? Skin infection on chin   Has been wearing a mask  She has had more pimples since wearing it   Sunday- had a spot that was more red and tender on her chin  Had a white head- could not express much material  Very sore Woke her up during the night  Cleaning with alcohol/ soap/water and a neutrogena cleansing pad   Then vaseline for the irritation  It is throbbing now   Also has a spot on L back that itches   Patient Active Problem List   Diagnosis Date Noted  . Simple abscess 04/20/2019  . Spasm of thoracic back muscle 02/04/2018  . Elevated glucose level 01/12/2018  . Loss of smell 05/27/2017  . Immunization reaction 01/26/2016  . Colon cancer screening 12/19/2015  . Routine general medical examination at a health care facility 12/07/2015  . Joint pain 05/16/2014  . Perimenopause 05/16/2014  . Microscopic hematuria 03/21/2014  . Migraine without aura, not refractory 07/31/2012  . SYNCOPE 10/05/2007  . INCREASED INTRAOCULAR PRESSURE 07/21/2007  . Allergic rhinitis 07/21/2007   Past Medical History:  Diagnosis Date  . Anemia   . Heavy menses   . History of allergic rhinitis   . History of recurrent UTIs    History reviewed. No pertinent surgical history. Social History   Tobacco Use  . Smoking status: Never Smoker  . Smokeless tobacco: Never Used  Substance Use Topics  . Alcohol use: No    Alcohol/week: 0.0 standard drinks  . Drug use: No   History reviewed. No pertinent family history. Allergies  Allergen Reactions  . Cetirizine Hcl     REACTION: sedation  . Ciprofloxacin     REACTION: joint pain  . Tdap [Tetanus-Diphth-Acell Pertussis] Other (See Comments)    Arm pain   Current Outpatient Medications on File Prior to Visit  Medication Sig Dispense Refill  . Aspirin-Acetaminophen-Caffeine (EXCEDRIN PO) Take by mouth as needed.     No  current facility-administered medications on file prior to visit.      Review of Systems  Constitutional: Negative for activity change, appetite change, fatigue, fever and unexpected weight change.  HENT: Negative for congestion, ear pain, rhinorrhea, sinus pressure and sore throat.   Eyes: Negative for pain, redness and visual disturbance.  Respiratory: Negative for cough, shortness of breath and wheezing.   Cardiovascular: Negative for chest pain and palpitations.  Gastrointestinal: Negative for abdominal pain, blood in stool, constipation and diarrhea.  Endocrine: Negative for polydipsia and polyuria.  Genitourinary: Negative for dysuria, frequency and urgency.  Musculoskeletal: Negative for arthralgias, back pain and myalgias.  Skin: Positive for wound. Negative for pallor and rash.  Allergic/Immunologic: Negative for environmental allergies.  Neurological: Negative for dizziness, syncope and headaches.  Hematological: Negative for adenopathy. Does not bruise/bleed easily.  Psychiatric/Behavioral: Negative for decreased concentration and dysphoric mood. The patient is not nervous/anxious.        Objective:   Physical Exam Constitutional:      General: She is not in acute distress.    Appearance: Normal appearance. She is normal weight. She is not ill-appearing.  HENT:     Head: Normocephalic and atraumatic.     Nose: Nose normal.     Mouth/Throat:     Mouth: Mucous membranes are moist.     Pharynx: Oropharynx is clear.  Eyes:  General:        Right eye: No discharge.        Left eye: No discharge.     Extraocular Movements: Extraocular movements intact.     Conjunctiva/sclera: Conjunctivae normal.     Pupils: Pupils are equal, round, and reactive to light.  Neck:     Musculoskeletal: Neck supple.  Cardiovascular:     Rate and Rhythm: Normal rate and regular rhythm.     Heart sounds: Normal heart sounds.  Pulmonary:     Effort: Pulmonary effort is normal. No  respiratory distress.     Breath sounds: No wheezing or rales.  Lymphadenopathy:     Cervical: No cervical adenopathy.  Skin:    General: Skin is warm and dry.     Findings: No rash.     Comments: Less than 1 cm firm pustule on L chin- unable to express material , erythematous and tender (not fluctuant)   Small dry papule on L back without erythema   Neurological:     Mental Status: She is alert.     Cranial Nerves: No cranial nerve deficit.  Psychiatric:        Mood and Affect: Mood normal.           Assessment & Plan:   Problem List Items Addressed This Visit      Other   Simple abscess    Small (less than 1 cm) infected papule/pimple on L chin  Disc cleaning with soap and water bid bactroban bid  Warm compress  Keflex 500 mg tid for 7d If worse/inc redness or pain or any fever-inst to call Will re check later this week at her appt   Small papule also seen on back- recommended using bactroban on that also

## 2019-04-20 NOTE — Telephone Encounter (Signed)
Pt said on 04/18/19 pt had acne bump on chin now today entire chin and jaw is red,swollen,and hurting. Last night the sheet brushed up against her chin and hurt so bad it woke pt up. Pt thinks there is pus in the sore. No covid symptoms, no travel and no known exposure to + covid. Pt scheduled in office visit today at 3pm with Dr Glori Bickers.

## 2019-04-21 ENCOUNTER — Encounter: Payer: Self-pay | Admitting: Family Medicine

## 2019-04-21 ENCOUNTER — Ambulatory Visit (INDEPENDENT_AMBULATORY_CARE_PROVIDER_SITE_OTHER): Payer: BC Managed Care – PPO | Admitting: Family Medicine

## 2019-04-21 VITALS — BP 132/80 | HR 72 | Temp 98.4°F | Ht 68.0 in | Wt 141.4 lb

## 2019-04-21 DIAGNOSIS — Z0000 Encounter for general adult medical examination without abnormal findings: Secondary | ICD-10-CM

## 2019-04-21 DIAGNOSIS — R053 Chronic cough: Secondary | ICD-10-CM | POA: Insufficient documentation

## 2019-04-21 DIAGNOSIS — Z1211 Encounter for screening for malignant neoplasm of colon: Secondary | ICD-10-CM

## 2019-04-21 DIAGNOSIS — L0291 Cutaneous abscess, unspecified: Secondary | ICD-10-CM | POA: Diagnosis not present

## 2019-04-21 DIAGNOSIS — R05 Cough: Secondary | ICD-10-CM | POA: Insufficient documentation

## 2019-04-21 DIAGNOSIS — R7309 Other abnormal glucose: Secondary | ICD-10-CM

## 2019-04-21 DIAGNOSIS — M533 Sacrococcygeal disorders, not elsewhere classified: Secondary | ICD-10-CM | POA: Insufficient documentation

## 2019-04-21 NOTE — Assessment & Plan Note (Signed)
On chin-already improved after 1 d of abx and bactroban Some GI upset-suggest taking abx with food

## 2019-04-21 NOTE — Assessment & Plan Note (Signed)
ifob kit ordered  Pt declines colonoscopy for now

## 2019-04-21 NOTE — Assessment & Plan Note (Signed)
Suspect this may be gerd related  inst to try pepcid 20 mg at bedtime for 4 weeks and update if no imp  Consider cxr  Nl exam

## 2019-04-21 NOTE — Assessment & Plan Note (Signed)
Mostly in hands Mother has RA (pt had neg serologies in 2015)  occ toes and larger joints Also notes that sternum pops  Suspect OA at this point/ no deformity on exam Will consider return to check xrays in future

## 2019-04-21 NOTE — Assessment & Plan Note (Signed)
Ongoing-no trauma  Consider possible arthritis Will plan xray possibly in the future

## 2019-04-21 NOTE — Patient Instructions (Addendum)
Please get a flu shot when they are available - very important this year  We will put you on a wait list for the shingrix vaccine and call you when it is available   Please do ifob kit for colon cancer screening and alert [place] when you become interested in a colonoscopy   Think about an exercise program for health  Try to get 1200-1500 mg of calcium per day with at least 1000 iu of vitamin D - for bone health    Call when you want to set up xrays of tail bone and hands and chest  Try pepcid 20 mg at bedtime for a month to see if it stops the cough   Take care of yourself  Try to get at least one more day of antibiotics for the spot on chin - it looks better !  Continue the ointment until it is gone

## 2019-04-21 NOTE — Assessment & Plan Note (Signed)
Lab Results  Component Value Date   HGBA1C 5.6 04/07/2019   disc imp of low glycemic diet and wt loss to prevent DM2  

## 2019-04-21 NOTE — Assessment & Plan Note (Signed)
Reviewed health habits including diet and exercise and skin cancer prevention Reviewed appropriate screening tests for age  Also reviewed health mt list, fam hx and immunization status , as well as social and family history   See HPI Labs reviewed  Doing well with menopausal transition and utd with gyn care and mammogram Enc self breast exams Put on waiting list for shingrix vaccine  Enc flu shot in the fall ifob kit for colon cancer screening (declines colonoscopy)  Excellent cholesterol profile

## 2019-04-21 NOTE — Progress Notes (Signed)
Subjective:    Patient ID: Alice Page, female    DOB: 07-18-1966, 53 y.o.   MRN: [id number]  HPI Here for health maintenance exam and to review chronic medical problems    Wt Readings from Last 3 Encounters:  04/21/19 141 lb 6.4 oz (64.1 kg)  04/20/19 140 lb 8 oz (63.7 kg)  02/04/18 138 lb 4 oz (62.7 kg)  she takes care to eat healthy/ is mindful  Not an exerciser but she is active  21.50 kg/m   She was seen yesterday for a small skin infection on chin-taking keflex and using bactroban  The abx made her stomach churn and gurgle with a soft stool  Already looks better   Gyn hx  Post menopausal-no periods at all  Hot flashes come and go - she is tolerating them  Pap 11/13/18 neg at gyn   Interested in the shingrix vaccine -wants to get on the wait list    Flu shots -does not get (she can however)  Tdap 3/17  Colon cancer screening  Not interested in colonoscopy  Interested in ifob kit   Mammogram 11/13/18 at gyn office  Self breast exam- no lumps (she is so lumpy in general it is hard to tell)  Family history :  Father had late life DM - that is better now  No cancer in the family    Blood pressure BP Readings from Last 3 Encounters:  04/21/19 132/80  04/20/19 126/82  02/04/18 122/80   Pulse Readings from Last 3 Encounters:  04/21/19 72  04/20/19 73  02/04/18 78    Labs: Results for orders placed or performed in visit on 04/07/19  TSH  Result Value Ref Range   TSH 2.64 0.35 - 4.50 uIU/mL  Lipid panel  Result Value Ref Range   Cholesterol 187 0 - 200 mg/dL   Triglycerides 65.0 0.0 - 149.0 mg/dL   HDL 81.40 >39.00 mg/dL   VLDL 13.0 0.0 - 40.0 mg/dL   LDL Cholesterol 93 0 - 99 mg/dL   Total CHOL/HDL Ratio 2    NonHDL 105.96   Hemoglobin A1c  Result Value Ref Range   Hgb A1c MFr Bld 5.6 4.6 - 6.5 %  Comprehensive metabolic panel  Result Value Ref Range   Sodium 140 135 - 145 mEq/L   Potassium 4.1 3.5 - 5.1 mEq/L   Chloride 103 96 - 112  mEq/L   CO2 30 19 - 32 mEq/L   Glucose, Bld 100 (H) 70 - 99 mg/dL   BUN 17 6 - 23 mg/dL   Creatinine, Ser 0.78 0.40 - 1.20 mg/dL   Total Bilirubin 0.7 0.2 - 1.2 mg/dL   Alkaline Phosphatase 45 39 - 117 U/L   AST 14 0 - 37 U/L   ALT 12 0 - 35 U/L   Total Protein 7.1 6.0 - 8.3 g/dL   Albumin 4.4 3.5 - 5.2 g/dL   Calcium 9.8 8.4 - 10.5 mg/dL   GFR 77.01 >60.00 mL/min  CBC with Differential/Platelet  Result Value Ref Range   WBC 4.6 4.0 - 10.5 K/uL   RBC 4.29 3.87 - 5.11 Mil/uL   Hemoglobin 13.1 12.0 - 15.0 g/dL   HCT 38.5 36.0 - 46.0 %   MCV 89.8 78.0 - 100.0 fl   MCHC 34.0 30.0 - 36.0 g/dL   RDW 12.7 11.5 - 15.5 %   Platelets 219.0 150.0 - 400.0 K/uL   Neutrophils Relative % 58.0 43.0 - 77.0 %   Lymphocytes  Relative 29.6 12.0 - 46.0 %   Monocytes Relative 8.5 3.0 - 12.0 %   Eosinophils Relative 3.3 0.0 - 5.0 %   Basophils Relative 0.6 0.0 - 3.0 %   Neutro Abs 2.7 1.4 - 7.7 K/uL   Lymphs Abs 1.4 0.7 - 4.0 K/uL   Monocytes Absolute 0.4 0.1 - 1.0 K/uL   Eosinophils Absolute 0.2 0.0 - 0.7 K/uL   Basophils Absolute 0.0 0.0 - 0.1 K/uL    Reassuring glucose control with fasting glucose of 100  Lab Results  Component Value Date   HGBA1C 5.6 04/07/2019      Cholesterol Lab Results  Component Value Date   CHOL 187 04/07/2019   CHOL 189 01/05/2018   CHOL 197 01/06/2017   Lab Results  Component Value Date   HDL 81.40 04/07/2019   HDL 88.60 01/05/2018   HDL 89.40 01/06/2017   Lab Results  Component Value Date   LDLCALC 93 04/07/2019   LDLCALC 85 01/05/2018   LDLCALC 97 01/06/2017   Lab Results  Component Value Date   TRIG 65.0 04/07/2019   TRIG 76.0 01/05/2018   TRIG 55.0 01/06/2017   Lab Results  Component Value Date   CHOLHDL 2 04/07/2019   CHOLHDL 2 01/05/2018   CHOLHDL 2 01/06/2017   No results found for: LDLDIRECT Excellent profile   H/o chronic cough - started with resp illness years ago  Never 100% stopped  Does not clear her throat  Always feels  like there is phlegm in her throat  Has allergies  When she lies down her sternum pops   Some joint pains  Toes/fingers -worse in 6 mo  Hurt and get swollen and tight  Comes and goes Mom has RA  We checked her labs in 2015   Has some knots in L thigh and in between legs  Rubbery feeling   Tailbone -hurt after she shifted in church one day  Worse to sit on soft surface   occ has urgency to have BM (cannot hold it like she used to)   Patient Active Problem List   Diagnosis Date Noted  . Chronic cough 04/21/2019  . Tail bone pain 04/21/2019  . Simple abscess 04/20/2019  . Elevated glucose level 01/12/2018  . Immunization reaction 01/26/2016  . Colon cancer screening 12/19/2015  . Routine general medical examination at a health care facility 12/07/2015  . Joint pain 05/16/2014  . Migraine without aura, not refractory 07/31/2012  . SYNCOPE 10/05/2007  . INCREASED INTRAOCULAR PRESSURE 07/21/2007  . Allergic rhinitis 07/21/2007   Past Medical History:  Diagnosis Date  . Anemia   . Heavy menses   . History of allergic rhinitis   . History of recurrent UTIs    History reviewed. No pertinent surgical history. Social History   Tobacco Use  . Smoking status: Never Smoker  . Smokeless tobacco: Never Used  Substance Use Topics  . Alcohol use: No    Alcohol/week: 0.0 standard drinks  . Drug use: No   History reviewed. No pertinent family history. Allergies  Allergen Reactions  . Cetirizine Hcl     REACTION: sedation  . Ciprofloxacin     REACTION: joint pain  . Tdap [Tetanus-Diphth-Acell Pertussis] Other (See Comments)    Arm pain   Current Outpatient Medications on File Prior to Visit  Medication Sig Dispense Refill  . cephALEXin (KEFLEX) 500 MG capsule Take 1 capsule (500 mg total) by mouth 3 (three) times daily. 21 capsule 0  . mupirocin  ointment (BACTROBAN) 2 % Apply 1 application topically 2 (two) times daily. To affected area 22 g 0  .  Aspirin-Acetaminophen-Caffeine (EXCEDRIN PO) Take by mouth as needed.     No current facility-administered medications on file prior to visit.     Review of Systems  Constitutional: Negative for activity change, appetite change, fatigue, fever and unexpected weight change.  HENT: Negative for congestion, ear pain, rhinorrhea, sinus pressure and sore throat.   Eyes: Negative for pain, redness and visual disturbance.  Respiratory: Positive for cough. Negative for apnea, chest tightness, shortness of breath, wheezing and stridor.   Cardiovascular: Negative for chest pain, palpitations and leg swelling.  Gastrointestinal: Negative for abdominal pain, blood in stool, constipation and diarrhea.       Occ trouble holding stool/ urgency  Endocrine: Negative for polydipsia and polyuria.  Genitourinary: Negative for dysuria, frequency, pelvic pain and urgency.  Musculoskeletal: Positive for arthralgias and joint swelling. Negative for back pain, myalgias and neck pain.  Skin: Positive for wound. Negative for pallor and rash.  Allergic/Immunologic: Negative for environmental allergies.  Neurological: Negative for dizziness, syncope, light-headedness, numbness and headaches.  Hematological: Negative for adenopathy. Does not bruise/bleed easily.  Psychiatric/Behavioral: Negative for decreased concentration and dysphoric mood. The patient is not nervous/anxious.        Objective:   Physical Exam Constitutional:      General: She is not in acute distress.    Appearance: Normal appearance. She is well-developed and normal weight. She is not ill-appearing.  HENT:     Head: Normocephalic and atraumatic.     Right Ear: Tympanic membrane, ear canal and external ear normal.     Left Ear: Tympanic membrane, ear canal and external ear normal.     Nose: Nose normal. No congestion or rhinorrhea.     Mouth/Throat:     Mouth: Mucous membranes are moist.     Pharynx: Oropharynx is clear. No oropharyngeal  exudate or posterior oropharyngeal erythema.  Eyes:     General: No scleral icterus.       Right eye: No discharge.        Left eye: No discharge.     Conjunctiva/sclera: Conjunctivae normal.     Pupils: Pupils are equal, round, and reactive to light.  Neck:     Musculoskeletal: Normal range of motion and neck supple. No muscular tenderness.     Thyroid: No thyromegaly.     Vascular: No carotid bruit or JVD.  Cardiovascular:     Rate and Rhythm: Normal rate and regular rhythm.     Heart sounds: Normal heart sounds. No gallop.   Pulmonary:     Effort: Pulmonary effort is normal. No respiratory distress.     Breath sounds: Normal breath sounds. No stridor. No wheezing, rhonchi or rales.     Comments: Good air exch Chest:     Chest wall: No tenderness.  Abdominal:     General: Bowel sounds are normal. There is no distension.     Palpations: Abdomen is soft. There is no mass.     Tenderness: There is no abdominal tenderness. There is no rebound.     Hernia: No hernia is present.  Musculoskeletal:        General: No tenderness.     Right lower leg: No edema.     Left lower leg: No edema.     Comments: No acute joint changes/deformity or tenderness in hands  Some pain in tailbone when shifting on the table  No rib tenderness    Lymphadenopathy:     Cervical: No cervical adenopathy.  Skin:    General: Skin is warm and dry.     Coloration: Skin is not pale.     Findings: No erythema or rash.     Comments: Several small soft/rubbery mobile masses consistent with lipomas- L hip and R post thigh (nontender)  Tanned  Solar lentigines diffusely   Infected pimple on L chin is much improved - 2-3 mm and less swollen/red  Neurological:     Mental Status: She is alert.     Cranial Nerves: No cranial nerve deficit.     Motor: No abnormal muscle tone.     Coordination: Coordination normal.     Gait: Gait normal.     Deep Tendon Reflexes: Reflexes are normal and symmetric. Reflexes  normal.  Psychiatric:        Mood and Affect: Mood normal.     Comments: Cheerful and talkative            Assessment & Plan:   Problem List Items Addressed This Visit      Other   Routine general medical examination at a health care facility - Primary    Reviewed health habits including diet and exercise and skin cancer prevention Reviewed appropriate screening tests for age  Also reviewed health mt list, fam hx and immunization status , as well as social and family history   See HPI Labs reviewed  Doing well with menopausal transition and utd with gyn care and mammogram Enc self breast exams Put on waiting list for shingrix vaccine  Enc flu shot in the fall ifob kit for colon cancer screening (declines colonoscopy)  Excellent cholesterol profile      Colon cancer screening    ifob kit ordered  Pt declines colonoscopy for now      Elevated glucose level    Lab Results  Component Value Date   HGBA1C 5.6 04/07/2019   disc imp of low glycemic diet and wt loss to prevent DM2       Simple abscess    On chin-already improved after 1 d of abx and bactroban Some GI upset-suggest taking abx with food

## 2019-04-26 ENCOUNTER — Telehealth: Payer: Self-pay | Admitting: Family Medicine

## 2019-04-26 NOTE — Telephone Encounter (Signed)
Please call pt to set up Shingrix, best dates are 7/14, 7/21/, 7/28 and 8/4.

## 2019-04-26 NOTE — Telephone Encounter (Signed)
I left a message for patient to return my call. 

## 2019-04-29 ENCOUNTER — Other Ambulatory Visit (INDEPENDENT_AMBULATORY_CARE_PROVIDER_SITE_OTHER): Payer: BC Managed Care – PPO

## 2019-04-29 ENCOUNTER — Other Ambulatory Visit: Payer: Self-pay | Admitting: Family Medicine

## 2019-04-29 DIAGNOSIS — Z1211 Encounter for screening for malignant neoplasm of colon: Secondary | ICD-10-CM

## 2019-04-29 LAB — FECAL OCCULT BLOOD, IMMUNOCHEMICAL: Fecal Occult Bld: NEGATIVE

## 2019-05-11 ENCOUNTER — Ambulatory Visit: Payer: BC Managed Care – PPO

## 2019-05-11 ENCOUNTER — Telehealth: Payer: Self-pay

## 2019-05-11 NOTE — Telephone Encounter (Signed)
[place] Night - Client Nonclinical Telephone Record AccessNurse Client [place] Night - Client Client Site [place] - Night Physician AA - PHYSICIAN, Verita Schneiders- MD Contact Type Call Who Is Calling Patient / Member / Family / Caregiver [name] Name [place] Phone Number [phone] Patient Name Alice Page Patient DOB February 02, 1966 Call Type Message Only Information Provided Reason for Call Request to [place] Appointment Initial Comment [name] states she needs to cancel her appt for tomorrow due to transportation issues that occurred during the last hour. Additional Comment Provided office hours. Call Closed By: Dory Larsen Transaction Date/Time: 05/10/2019 9:01:33 PM (ET)

## 2019-05-11 NOTE — Telephone Encounter (Signed)
Per schedule appt has been cancelled and rescheduled.

## 2019-05-25 ENCOUNTER — Ambulatory Visit: Payer: Self-pay

## 2019-07-20 ENCOUNTER — Ambulatory Visit: Payer: BC Managed Care – PPO

## 2019-09-02 ENCOUNTER — Ambulatory Visit: Payer: Self-pay

## 2020-03-14 DIAGNOSIS — N764 Abscess of vulva: Secondary | ICD-10-CM | POA: Diagnosis not present

## 2020-03-14 DIAGNOSIS — N952 Postmenopausal atrophic vaginitis: Secondary | ICD-10-CM | POA: Diagnosis not present

## 2020-03-14 DIAGNOSIS — Z1231 Encounter for screening mammogram for malignant neoplasm of breast: Secondary | ICD-10-CM | POA: Diagnosis not present

## 2020-04-06 ENCOUNTER — Emergency Department
Admission: EM | Admit: 2020-04-06 | Discharge: 2020-04-06 | Disposition: A | Discharge status: Home / Self Care | Payer: BC Managed Care – PPO | Attending: Emergency Medicine | Admitting: Emergency Medicine

## 2020-04-06 ENCOUNTER — Other Ambulatory Visit: Payer: Self-pay

## 2020-04-06 ENCOUNTER — Emergency Department: Payer: BC Managed Care – PPO

## 2020-04-06 DIAGNOSIS — M4726 Other spondylosis with radiculopathy, lumbar region: Secondary | ICD-10-CM | POA: Diagnosis not present

## 2020-04-06 DIAGNOSIS — Y9389 Activity, other specified: Secondary | ICD-10-CM | POA: Insufficient documentation

## 2020-04-06 DIAGNOSIS — M5441 Lumbago with sciatica, right side: Secondary | ICD-10-CM | POA: Diagnosis not present

## 2020-04-06 DIAGNOSIS — W19XXXA Unspecified fall, initial encounter: Secondary | ICD-10-CM | POA: Diagnosis not present

## 2020-04-06 DIAGNOSIS — S3993XA Unspecified injury of pelvis, initial encounter: Secondary | ICD-10-CM | POA: Diagnosis not present

## 2020-04-06 DIAGNOSIS — M25551 Pain in right hip: Secondary | ICD-10-CM | POA: Insufficient documentation

## 2020-04-06 DIAGNOSIS — M4727 Other spondylosis with radiculopathy, lumbosacral region: Secondary | ICD-10-CM | POA: Diagnosis not present

## 2020-04-06 DIAGNOSIS — Y9289 Other specified places as the place of occurrence of the external cause: Secondary | ICD-10-CM | POA: Insufficient documentation

## 2020-04-06 DIAGNOSIS — S79911A Unspecified injury of right hip, initial encounter: Secondary | ICD-10-CM | POA: Diagnosis not present

## 2020-04-06 DIAGNOSIS — Y999 Unspecified external cause status: Secondary | ICD-10-CM | POA: Insufficient documentation

## 2020-04-06 DIAGNOSIS — R52 Pain, unspecified: Secondary | ICD-10-CM | POA: Diagnosis not present

## 2020-04-06 DIAGNOSIS — M545 Low back pain: Secondary | ICD-10-CM | POA: Diagnosis not present

## 2020-04-06 DIAGNOSIS — M5489 Other dorsalgia: Secondary | ICD-10-CM | POA: Diagnosis not present

## 2020-04-06 DIAGNOSIS — Y92009 Unspecified place in unspecified non-institutional (private) residence as the place of occurrence of the external cause: Secondary | ICD-10-CM

## 2020-04-06 DIAGNOSIS — S3992XA Unspecified injury of lower back, initial encounter: Secondary | ICD-10-CM | POA: Diagnosis not present

## 2020-04-06 MED ORDER — OXYCODONE-ACETAMINOPHEN 5-325 MG PO TABS
1.0000 | ORAL_TABLET | Freq: Once | ORAL | Status: AC
  Administered 2020-04-06: 1 via ORAL
  Filled 2020-04-06: qty 1

## 2020-04-06 MED ORDER — DEXAMETHASONE SODIUM PHOSPHATE 10 MG/ML IJ SOLN
10.0000 mg | Freq: Once | INTRAMUSCULAR | Status: AC
  Administered 2020-04-06: 10 mg via INTRAVENOUS
  Filled 2020-04-06: qty 1

## 2020-04-06 MED ORDER — OXYCODONE-ACETAMINOPHEN 5-325 MG PO TABS
1.0000 | ORAL_TABLET | ORAL | Status: DC | PRN
  Administered 2020-04-06: 1 via ORAL
  Filled 2020-04-06: qty 1

## 2020-04-06 MED ORDER — CYCLOBENZAPRINE HCL 10 MG PO TABS
5.0000 mg | ORAL_TABLET | Freq: Once | ORAL | Status: AC
  Administered 2020-04-06: 5 mg via ORAL
  Filled 2020-04-06: qty 1

## 2020-04-06 MED ORDER — OXYCODONE-ACETAMINOPHEN 5-325 MG PO TABS: 1.0000 | ORAL_TABLET | Freq: Four times a day (QID) | ORAL | 0 refills | Status: DC | PRN

## 2020-04-06 MED ORDER — CYCLOBENZAPRINE HCL 5 MG PO TABS: 5.0000 mg | ORAL_TABLET | Freq: Three times a day (TID) | ORAL | 0 refills | Status: DC | PRN

## 2020-04-06 MED ORDER — KETOROLAC TROMETHAMINE 30 MG/ML IJ SOLN
30.0000 mg | Freq: Once | INTRAMUSCULAR | Status: AC
  Administered 2020-04-06: 30 mg via INTRAVENOUS
  Filled 2020-04-06: qty 1

## 2020-04-06 MED ORDER — PREDNISONE 10 MG PO TABS
ORAL_TABLET | ORAL | 0 refills | Status: DC
Start: 2020-04-06 — End: 2021-01-23

## 2020-04-06 NOTE — ED Notes (Signed)
Family at bedside  Pt states her pain meds are not working  Cont's to have pain  Up with assistance to b/r

## 2020-04-06 NOTE — ED Triage Notes (Signed)
Pt arrives to ED via [facility] from home with c/o lower back/right hip pain x1 day. Pt reports she fell 2 weeks ago, but never sought treatment. Pt denies loss of bowel or bladder control. EMS reports the pt was given "between 50-100 mcgs" of Fentanyl IV en route, but states "the IV might have been infiltrated". Pt is A&O, in NAD; RR even, regular, and unlabored.

## 2020-04-06 NOTE — Discharge Instructions (Signed)
Call make an appointment with your primary care provider for follow-up to see if additional steroids are needed.  Begin taking prednisone as directed tapering down by 1 tablet each day.  The Percocet is every 6 hours if needed for moderate to severe pain.  Do not drive or operate machinery while taking this medication along with the Flexeril which is 3 times a day if needed for muscle spasms.  You may use ice or heat to your lower back and hip as needed for discomfort.  If you are lying in bed on your back put 2 pillows underneath your knees to elevate them which will help with your back.  If you are lying on your side put one pillow between your knees to keep your spine aligned.  Return to the emergency department if any incontinence of bowel or bladder.

## 2020-04-06 NOTE — ED Notes (Signed)
See triage note  Presents with right hip/leg pain  States she fell about 2 weeks ago   But states pain became worse 2 days ago   states she feels like her leg is numb

## 2020-04-06 NOTE — ED Provider Notes (Signed)
[facility] Emergency Department Provider Note   ____________________________________________   First MD Initiated Contact with Patient 04/06/20 [phone]     (approximate)  I have reviewed the triage vital signs and the nursing notes.   HISTORY  Chief Complaint Back Pain   HPI Alice Page is a 54 y.o. female patient presents to the ED via EMS from home with complaint of right low back and right hip pain x1 day.  She was given fentanyl IV while in route to the ED.  She states that that medication did not help very much.  Patient states that she fell 2 weeks ago in the garage landing on concrete.  She denies any head injury or loss of consciousness at that time.  She has been taking Tylenol, NSAIDs and some leftover muscle relaxants without any relief.  She states that the pain got worse last evening.  She now reports that she has a numb sensation radiating down her right leg.  She rates her pain as a 10/10.       Past Medical History:  Diagnosis Date  . Anemia   . Heavy menses   . History of allergic rhinitis   . History of recurrent UTIs     Patient Active Problem List   Diagnosis Date Noted  . Chronic cough 04/21/2019  . Tail bone pain 04/21/2019  . Simple abscess 04/20/2019  . Elevated glucose level 01/12/2018  . Immunization reaction 01/26/2016  . Colon cancer screening 12/19/2015  . Routine general medical examination at a health care facility 12/07/2015  . Joint pain 05/16/2014  . Migraine without aura, not refractory 07/31/2012  . SYNCOPE 10/05/2007  . INCREASED INTRAOCULAR PRESSURE 07/21/2007  . Allergic rhinitis 07/21/2007    History reviewed. No pertinent surgical history.  Prior to Admission medications   Medication Sig Start Date End Date Taking? Authorizing Provider  Aspirin-Acetaminophen-Caffeine (EXCEDRIN PO) Take by mouth as needed.    [provider]  cyclobenzaprine (FLEXERIL) 5 MG tablet Take 1 tablet (5 mg  total) by mouth 3 (three) times daily as needed for muscle spasms. 04/06/20   Johnn Hai, PA-C  mupirocin ointment (BACTROBAN) 2 % Apply 1 application topically 2 (two) times daily. To affected area 04/20/19   Tower, Wynelle Fanny, MD  oxyCODONE-acetaminophen (PERCOCET) 5-325 MG tablet Take 1 tablet by mouth every 6 (six) hours as needed for moderate pain or severe pain. 04/06/20   Johnn Hai, PA-C  predniSONE (DELTASONE) 10 MG tablet Take 6 tablets  today, on day 2 take 5 tablets, day 3 take 4 tablets, day 4 take 3 tablets, day 5 take  2 tablets and 1 tablet the last day 04/06/20   Johnn Hai, PA-C    Allergies Cetirizine hcl, Ciprofloxacin, and Tdap [tetanus-diphth-acell pertussis]  No family history on file.  Social History Social History   Tobacco Use  . Smoking status: Never Smoker  . Smokeless tobacco: Never Used  Substance Use Topics  . Alcohol use: No    Alcohol/week: 0.0 standard drinks  . Drug use: No    Review of Systems Constitutional: No fever/chills Eyes: No visual changes. ENT: No sore throat. Cardiovascular: Denies chest pain. Respiratory: Denies shortness of breath. Gastrointestinal: No abdominal pain.  No nausea, no vomiting.   Musculoskeletal: Positive low back pain, right hip pain. Skin: Negative for rash. Neurological: Negative for headaches, focal weakness or numbness.  ____________________________________________   PHYSICAL EXAM:  VITAL SIGNS: ED Triage Vitals [04/06/20 0409]  Enc Vitals Group     BP (!) 121/96     Pulse Rate 69     Resp 18     Temp 98.3 F (36.8 C)     Temp Source Oral     SpO2 100 %     Weight 130 lb (59 kg)     Height 5\' 9"  (1.753 m)     Head Circumference      Peak Flow      Pain Score 10     Pain Loc      Pain Edu?      Excl. in GC?     Constitutional: Alert and oriented. Well appearing and in no acute distress.  Patient is lying on her left side and avoids movement secondary to increased pain. Eyes:  Conjunctivae are normal.  Head: Atraumatic. Neck: No stridor.   Cardiovascular: Normal rate, regular rhythm. Grossly normal heart sounds.  Good peripheral circulation. Respiratory: Normal respiratory effort.  No retractions. Lungs CTAB. Gastrointestinal: Soft and nontender. No distention. Musculoskeletal: On examination of the lumbar spine there is no soft tissue edema, abrasions or ecchymosis noted.  There is tenderness on palpation of L5-S1 area and the right SI joint and surrounding soft tissue.  No step-offs are appreciated with palpation of the lumbar spine.  No bruising is noted to the right hip.  Patient is moderately tender also on the lateral aspect of the hip.  Range of motion is decreased and guarded secondary to pain. Neurologic:  Normal speech and language. No gross focal neurologic deficits are appreciated.  Skin:  Skin is warm, dry and intact.  Psychiatric: Mood and affect are normal. Speech and behavior are normal.  ____________________________________________   LABS (all labs ordered are listed, but only abnormal results are displayed)  Labs Reviewed - No data to display  RADIOLOGY  Official radiology report(s): DG Lumbar Spine 2-3 Views  Result Date: 04/06/2020 CLINICAL DATA:  Fall 2 weeks ago EXAM: LUMBAR SPINE - 2-3 VIEW COMPARISON:  None. FINDINGS: There is no evidence of lumbar spine fracture. Alignment is normal. Intervertebral disc spaces are maintained. IMPRESSION: Negative. Electronically Signed   By: 04/08/2020 M.D.   On: 04/06/2020 04:44   DG Sacrum/Coccyx  Result Date: 04/06/2020 CLINICAL DATA:  Fall 2 weeks ago EXAM: SACRUM AND COCCYX - 2+ VIEW COMPARISON:  None. FINDINGS: There is no evidence of fracture or other focal bone lesions. IMPRESSION: Negative. Electronically Signed   By: 04/08/2020 M.D.   On: 04/06/2020 04:43   CT Lumbar Spine Wo Contrast  Result Date: 04/06/2020 CLINICAL DATA:  Right hip and leg pain for 2 weeks after a fall. Symptoms  worsened 2 days ago. Initial encounter. EXAM: CT LUMBAR SPINE WITHOUT CONTRAST TECHNIQUE: Multidetector CT imaging of the lumbar spine was performed without intravenous contrast administration. Multiplanar CT image reconstructions were also generated. COMPARISON:  Plain films lumbar spine today. FINDINGS: Segmentation: Standard. Alignment: Normal. Vertebrae: No acute fracture or focal pathologic process. Paraspinal and other soft tissues: Negative. Disc levels: T12-L1: Negative. L1-2: Negative. L2-3: Mild facet degenerative change.  Otherwise negative. L3-4: Mild facet degenerative change.  Otherwise negative. L4-5: Minimal disc bulge and mild facet degenerative disease without stenosis. L5-S1: Shallow disc bulge and mild facet degenerative change without stenosis. IMPRESSION: No acute abnormality. Mild degenerative disease without central canal or foraminal narrowing. Electronically Signed   By: 04/08/2020 M.D.   On: 04/06/2020 10:00   DG Hip Unilat  With Pelvis 2-3 Views Right  Result Date: 04/06/2020 CLINICAL DATA:  Pain status post fall 2 weeks ago EXAM: DG HIP (WITH OR WITHOUT PELVIS) 2-3V RIGHT COMPARISON:  None. FINDINGS: There is no evidence of hip fracture or dislocation. There is no evidence of arthropathy or other focal bone abnormality. IMPRESSION: Negative. Electronically Signed   By: Jonna Clark M.D.   On: 04/06/2020 04:42    ____________________________________________   PROCEDURES  Procedure(s) performed (including Critical Care):  Procedures   ____________________________________________   INITIAL IMPRESSION / ASSESSMENT AND PLAN / ED COURSE  As part of my medical decision making, I reviewed the following data within the electronic MEDICAL RECORD NUMBER Notes from prior ED visits and [place] Controlled Substance Database  54 year old female presents to the ED with complaint of low back pain radiating to her right hip and to her thigh.  Patient has a history of mechanical fall in  the garage 2 weeks ago.  Patient states that the she has been using over-the-counter medication and some leftover muscle relaxants without any relief.  There was no head injury involved in this incident.  Patient was given fentanyl prior to arrival per EMS.  She was also given Percocet and Toradol IV.  Patient reported that there was little to no relief of her pain.  X-rays of her hip and lumbar spine were negative for any acute fracture.  A CT scan also confirmed no acute fractures.  Patient has degenerative changes in multiple areas both in her low back and hip.  Patient was made aware.  She was given a Percocet and Decadron IV prior to discharge.  Patient is to continue with a prednisone taper as anti-inflammatories have not helped her at home.  A prescription for Flexeril was sent to her pharmacy as well as Percocet and the prednisone taper.  Patient is encouraged to use ice or heat to her back as needed for discomfort.  She is to follow-up with her PCP if any continued problems.  She is encouraged to return to the emergency department if any severe worsening of her symptoms or incontinence of bowel or bladder.  ____________________________________________   FINAL CLINICAL IMPRESSION(S) / ED DIAGNOSES  Final diagnoses:  Acute right-sided low back pain with right-sided sciatica  Osteoarthritis of spine with radiculopathy, lumbosacral region  Acute right hip pain  Fall at home, initial encounter     ED Discharge Orders         Ordered    predniSONE (DELTASONE) 10 MG tablet     Discontinue  Reprint     04/06/20 1200    oxyCODONE-acetaminophen (PERCOCET) 5-325 MG tablet  Every 6 hours PRN     Discontinue  Reprint     04/06/20 1200    cyclobenzaprine (FLEXERIL) 5 MG tablet  3 times daily PRN     Discontinue  Reprint     04/06/20 1200           Note:  This document was prepared using Dragon voice recognition software and may include unintentional dictation errors.    Tommi Rumps,  PA-C 04/06/20 1340    Shaune Pollack, MD 04/10/20 [phone]

## 2020-04-07 ENCOUNTER — Ambulatory Visit
Admission: RE | Admit: 2020-04-07 | Discharge: 2020-04-07 | Disposition: A | Discharge status: Home / Self Care | Payer: BC Managed Care – PPO | Source: Ambulatory Visit | Attending: Family Medicine | Admitting: Family Medicine

## 2020-04-07 ENCOUNTER — Telehealth: Payer: Self-pay | Admitting: Family Medicine

## 2020-04-07 ENCOUNTER — Ambulatory Visit: Payer: BC Managed Care – PPO | Admitting: Family Medicine

## 2020-04-07 ENCOUNTER — Encounter: Payer: Self-pay | Admitting: Family Medicine

## 2020-04-07 ENCOUNTER — Telehealth: Payer: Self-pay

## 2020-04-07 ENCOUNTER — Other Ambulatory Visit: Payer: Self-pay

## 2020-04-07 DIAGNOSIS — M5416 Radiculopathy, lumbar region: Secondary | ICD-10-CM | POA: Insufficient documentation

## 2020-04-07 DIAGNOSIS — M519 Unspecified thoracic, thoracolumbar and lumbosacral intervertebral disc disorder: Secondary | ICD-10-CM | POA: Insufficient documentation

## 2020-04-07 DIAGNOSIS — M545 Low back pain: Secondary | ICD-10-CM | POA: Diagnosis not present

## 2020-04-07 NOTE — Telephone Encounter (Signed)
As soon as I get the final report I will put in a stat referral Shirlee Limerick is aware and prepped for that)  If pain becomes unbearable-go to the ER (they should be able to see MRI report)  Take medications as we discussed  I am routing this back to myself as well

## 2020-04-07 NOTE — Telephone Encounter (Signed)
Call Report: Suspicion for superior rt foraminal disc extrusion at L2-3 with associated rt L2 nerve root encroachment. No other acute findings.

## 2020-04-07 NOTE — Telephone Encounter (Signed)
Pt notified of radiologist call and Dr. Royden Purl comments. Pt said that she will see any neurologist Dr. Milinda Antis recommends she doesn't have any preference of either [place] or [place] she said she will go where ever Dr. Milinda Antis thinks is the best. Pt said her pain is the exact same as when she was here. Pt said it's not worsening it's the same.

## 2020-04-07 NOTE — Telephone Encounter (Signed)
I will see her then  

## 2020-04-07 NOTE — Telephone Encounter (Signed)
Ref to neurosurg Directing to [facility]

## 2020-04-07 NOTE — Assessment & Plan Note (Signed)
Severe/worsening R sided lumbar/buttock and leg pain  Fall 2 wk ago  ER visit yesterday  Reviewed hospital records, lab results and studies in detail    Has had CT Now entirely numb in lateral leg from hip to knee on R -concerning Urgent ref for MRI

## 2020-04-07 NOTE — Patient Instructions (Signed)
Working on MRI referral  Our office will call you as soon as we know   If you need to go to ER in the meantime please do

## 2020-04-07 NOTE — Telephone Encounter (Signed)
Preliminary report on MRI shows disc extrusion L2-3 with L2 nerve root encroachment   (I have to wait on the final report-should be very soon, this was just a call from radiology to give me the heads up  I will most likely refer her to neuro surgeon but have to get the final first   How is she doing?

## 2020-04-07 NOTE — Telephone Encounter (Signed)
Pt was seen at [facility] ED on 04/06/20; pt fell 2 wks ago on rt side; now pts rt hip pain is pain level 5 if laying and goes to 10 if tries to walk; numbness from rt groin to knee; rt hip pain started on 04/05/20 suddenly. Pt not urinated since 04/06/20 at 1 PM. At [facility] ED on 04/06/20 had neg hip,sacrum/coccyx, and LS spine xrays. CT of LS and pelvis no acute abnormality.  Pt thinks she needs an MRI. pt was given prednisone,  cyclobenzaprine and oxycodone apap 5 -325 which helps some with the pain but pt said pain still excruciating when tries to walk. Pt has no covid symptoms, no travel and no known exposure to + covid. Pt scheduled in office appt with Dr Milinda Antis today at 10:15 that was [facility] by Dr Milinda Antis.

## 2020-04-07 NOTE — Telephone Encounter (Signed)
Pt notified of Dr. Royden Purl comments and verbalized understanding she will wait for our call on Monday unless pain sever she will go to ER

## 2020-04-07 NOTE — Progress Notes (Signed)
Subjective:    Patient ID: Alice Page, female    DOB: 02/20/1966, 54 y.o.   MRN: [id number]  This visit occurred during the SARS-CoV-2 public health emergency.  Safety protocols were in place, including screening questions prior to the visit, additional usage of staff PPE, and extensive cleaning of exam room while observing appropriate contact time as indicated for disinfecting solutions.   HPI Pt presents for hip/back pain   Wt Readings from Last 3 Encounters:  04/07/20 134 lb 6 oz (61 kg)  04/06/20 130 lb (59 kg)  04/21/19 141 lb 6.4 oz (64.1 kg)   20.13 kg/m    Seen in ER yesterday  Noted fall 2 wk ago in garage  Took tylenol/nsaid and old muscle relaxer  Had 10/10 pain -called EMS and given fentanyl  Then percocet and toradol IV  Noted numbness rad down R leg   S/c with flexeril and percocet and prednisone   Per pt she was in garage - wet floor and she hit on the R side  (2 wk ago)  Hit so hard she urinated  Was able to get up and walk -kept going  Arm bruised/not leg   Her severe pain started out of the blue 2 days ago  She turned on hose (crouched) -came in and sat down and pain was instant and severe   Numb from knee to hip -very numb laterally  Also pain down the leg worse to stand or walk or sit  Can only get comfortable lying on L side None in foot  No saddle anesthesia  No trouble holding urine   No weakness   Rad studies: as follows    RADIOLOGY  Official radiology report(s): DG Lumbar Spine 2-3 Views  Result Date: 04/06/2020 CLINICAL DATA:  Fall 2 weeks ago EXAM: LUMBAR SPINE - 2-3 VIEW COMPARISON:  None. FINDINGS: There is no evidence of lumbar spine fracture. Alignment is normal. Intervertebral disc spaces are maintained. IMPRESSION: Negative. Electronically Signed   By: Jonna Clark M.D.   On: 04/06/2020 04:44   DG Sacrum/Coccyx  Result Date: 04/06/2020 CLINICAL DATA:  Fall 2 weeks ago EXAM: SACRUM AND COCCYX - 2+ VIEW COMPARISON:   None. FINDINGS: There is no evidence of fracture or other focal bone lesions. IMPRESSION: Negative. Electronically Signed   By: Jonna Clark M.D.   On: 04/06/2020 04:43   CT Lumbar Spine Wo Contrast  Result Date: 04/06/2020 CLINICAL DATA:  Right hip and leg pain for 2 weeks after a fall. Symptoms worsened 2 days ago. Initial encounter. EXAM: CT LUMBAR SPINE WITHOUT CONTRAST TECHNIQUE: Multidetector CT imaging of the lumbar spine was performed without intravenous contrast administration. Multiplanar CT image reconstructions were also generated. COMPARISON:  Plain films lumbar spine today. FINDINGS: Segmentation: Standard. Alignment: Normal. Vertebrae: No acute fracture or focal pathologic process. Paraspinal and other soft tissues: Negative. Disc levels: T12-L1: Negative. L1-2: Negative. L2-3: Mild facet degenerative change.  Otherwise negative. L3-4: Mild facet degenerative change.  Otherwise negative. L4-5: Minimal disc bulge and mild facet degenerative disease without stenosis. L5-S1: Shallow disc bulge and mild facet degenerative change without stenosis. IMPRESSION: No acute abnormality. Mild degenerative disease without central canal or foraminal narrowing. Electronically Signed   By: Drusilla Kanner M.D.   On: 04/06/2020 10:00   DG Hip Unilat  With Pelvis 2-3 Views Right  Result Date: 04/06/2020 CLINICAL DATA:  Pain status post fall 2 weeks ago EXAM: DG HIP (WITH OR WITHOUT PELVIS) 2-3V RIGHT COMPARISON:  None.  FINDINGS: There is no evidence of hip fracture or dislocation. There is no evidence of arthropathy or other focal bone abnormality. IMPRESSION: Negative. Electronically Signed   By: Jonna Clark M.D.   On: 04/06/2020 04:42     Patient Active Problem List   Diagnosis Date Noted  . Lumbar back pain with radiculopathy affecting right lower extremity 04/07/2020  . Chronic cough 04/21/2019  . Tail bone pain 04/21/2019  . Simple abscess 04/20/2019  . Elevated glucose level 01/12/2018  .  Immunization reaction 01/26/2016  . Colon cancer screening 12/19/2015  . Routine general medical examination at a health care facility 12/07/2015  . Joint pain 05/16/2014  . Migraine without aura, not refractory 07/31/2012  . SYNCOPE 10/05/2007  . INCREASED INTRAOCULAR PRESSURE 07/21/2007  . Allergic rhinitis 07/21/2007   Past Medical History:  Diagnosis Date  . Anemia   . Heavy menses   . History of allergic rhinitis   . History of recurrent UTIs    History reviewed. No pertinent surgical history. Social History   Tobacco Use  . Smoking status: Never Smoker  . Smokeless tobacco: Never Used  Substance Use Topics  . Alcohol use: No    Alcohol/week: 0.0 standard drinks  . Drug use: No   History reviewed. No pertinent family history. Allergies  Allergen Reactions  . Cetirizine Hcl     REACTION: sedation  . Ciprofloxacin     REACTION: joint pain  . Tdap [Tetanus-Diphth-Acell Pertussis] Other (See Comments)    Arm pain   Current Outpatient Medications on File Prior to Visit  Medication Sig Dispense Refill  . Aspirin-Acetaminophen-Caffeine (EXCEDRIN PO) Take by mouth as needed.    . cyclobenzaprine (FLEXERIL) 5 MG tablet Take 1 tablet (5 mg total) by mouth 3 (three) times daily as needed for muscle spasms. 21 tablet 0  . oxyCODONE-acetaminophen (PERCOCET) 5-325 MG tablet Take 1 tablet by mouth every 6 (six) hours as needed for moderate pain or severe pain. 15 tablet 0  . predniSONE (DELTASONE) 10 MG tablet Take 6 tablets  today, on day 2 take 5 tablets, day 3 take 4 tablets, day 4 take 3 tablets, day 5 take  2 tablets and 1 tablet the last day 21 tablet 0   No current facility-administered medications on file prior to visit.    Review of Systems  Constitutional: Negative for activity change, appetite change, fatigue, fever and unexpected weight change.  HENT: Negative for congestion, ear pain, rhinorrhea, sinus pressure and sore throat.   Eyes: Negative for pain, redness  and visual disturbance.  Respiratory: Negative for cough, shortness of breath and wheezing.   Cardiovascular: Negative for chest pain and palpitations.  Gastrointestinal: Negative for abdominal pain, blood in stool, constipation and diarrhea.  Endocrine: Negative for polydipsia and polyuria.  Genitourinary: Negative for dysuria, frequency and urgency.  Musculoskeletal: Positive for back pain and gait problem. Negative for arthralgias and myalgias.  Skin: Negative for pallor and rash.  Allergic/Immunologic: Negative for environmental allergies.  Neurological: Positive for numbness. Negative for dizziness, syncope and headaches.  Hematological: Negative for adenopathy. Does not bruise/bleed easily.  Psychiatric/Behavioral: Negative for decreased concentration and dysphoric mood. The patient is not nervous/anxious.        Objective:   Physical Exam Constitutional:      General: She is in acute distress.     Comments: Pt lying on L side on table, in acute distress with pain    HENT:     Head: Normocephalic and atraumatic.  Mouth/Throat:     Mouth: Mucous membranes are moist.  Eyes:     General: No scleral icterus.    Conjunctiva/sclera: Conjunctivae normal.     Pupils: Pupils are equal, round, and reactive to light.  Cardiovascular:     Rate and Rhythm: Normal rate and regular rhythm.     Heart sounds: Normal heart sounds.  Pulmonary:     Effort: Pulmonary effort is normal. No respiratory distress.     Breath sounds: Normal breath sounds. No wheezing or rales.  Abdominal:     General: Abdomen is flat. There is no distension.     Tenderness: There is no abdominal tenderness.  Musculoskeletal:     Cervical back: Normal range of motion. No rigidity or tenderness.     Lumbar back: Spasms, tenderness and bony tenderness present. No swelling, deformity, signs of trauma or lacerations. Decreased range of motion. Positive right straight leg raise test. Negative left straight leg raise  test.     Right lower leg: No edema.     Left lower leg: No edema.     Comments: Mod to severe pain with any movement of LS Tender over lower LS and some over R greater trochanter R lumbar muscles are tight /also in SI area  Nl rom of hips  Nl strength in RLE Sensation to lt touch is reduced in R lat thigh  Skin:    General: Skin is warm and dry.     Coloration: Skin is not pale.     Findings: No erythema or rash.  Neurological:     Mental Status: She is alert.     Sensory: Sensory deficit present.     Motor: No weakness.     Gait: Gait abnormal.     Deep Tendon Reflexes: Reflexes normal.  Psychiatric:        Mood and Affect: Mood normal.           Assessment & Plan:   Problem List Items Addressed This Visit      Nervous and Auditory   Lumbar back pain with radiculopathy affecting right lower extremity    Severe/worsening R sided lumbar/buttock and leg pain  Fall 2 wk ago  ER visit yesterday  Reviewed hospital records, lab results and studies in detail    Has had CT Now entirely numb in lateral leg from hip to knee on R -concerning Urgent ref for MRI       Relevant Orders   MR Lumbar Spine Wo Contrast

## 2020-04-08 NOTE — Telephone Encounter (Signed)
I placed the ref and routed to [facility]

## 2020-04-10 DIAGNOSIS — M5416 Radiculopathy, lumbar region: Secondary | ICD-10-CM | POA: Diagnosis not present

## 2020-04-10 DIAGNOSIS — M5126 Other intervertebral disc displacement, lumbar region: Secondary | ICD-10-CM | POA: Diagnosis not present

## 2020-04-10 NOTE — Telephone Encounter (Signed)
Urgent referral sent to [facility]. Will also call them to ask for Urgent appointment

## 2020-04-11 ENCOUNTER — Telehealth: Payer: Self-pay | Admitting: *Deleted

## 2020-04-11 MED ORDER — OXYCODONE-ACETAMINOPHEN 5-325 MG PO TABS: 1.0000 | ORAL_TABLET | Freq: Four times a day (QID) | ORAL | 0 refills | Status: AC | PRN

## 2020-04-11 MED ORDER — CYCLOBENZAPRINE HCL 5 MG PO TABS: 5.0000 mg | ORAL_TABLET | Freq: Three times a day (TID) | ORAL | 0 refills | Status: DC | PRN

## 2020-04-11 NOTE — Telephone Encounter (Signed)
Pt called access nurse after hrs. There is a Clinical cytogeneticist message with her asking about meds incase you would like to look at it also, pt was given 15 percocet at the hospital and 21 flexeril so she has not more, [facility] [address]

## 2020-04-11 NOTE — Telephone Encounter (Signed)
Pt notified Rxs sent. Pt said she saw the neuro doc yesterday 04/10/20, pt said they scheduled her for surgery on Thursday. Pt advised that neuro will have to take over meds after surgery. Pt said she's stable but still in a lot of pain but will f/u with neuro for any other med needs

## 2020-04-11 NOTE — Telephone Encounter (Signed)
[facility] Night - Client TELEPHONE ADVICE RECORD AccessNurse Patient Name: Alice Page Gender: Female DOB: [birth year] Age: 54 Y 10 M 6 D Return Phone Number: [phone] (Primary) Address[place] [address] Client [facility] Night - Client Client Site [facility] - Night Physician Tower, Idamae Schuller - MD Contact Type Call Who Is Calling Patient / Member / Family / Caregiver Call Type Triage / Clinical Relationship To Patient Self Return Phone Number [phone] (Primary) Chief Complaint Back Pain - General Reason for Call Symptomatic / Request for Health Information Initial Comment [name] states a MRI was ordered for her back and is in need of pain medication. [name] states she would like to know what she needs to do in order to get refills on her pain medication. Translation No Nurse Assessment Nurse: Berna Bue, RN, Megan Date/Time (Eastern Time): 04/10/2020 6:06:14 PM Please select the assessment type ---Refill Additional Documentation ---[name] states she has been having back pain. She had an MRI. She is going to need back surgery. [name] is requesting a refill of Percocet 5-325mg  [facility] [address] @ [phone] Does the patient have enough medication to last until the office opens? ---Yes Guidelines Guideline Title Affirmed Question Affirmed Notes Nurse Date/Time (Eastern Time) Disp. Time Lamount Cohen Time) Disposition Final User 04/10/2020 6:11:47 PM Pharmacy Call Berna Bue, RN, Aundra Millet Reason: Patient has enough medication tonight. [name] will call back to PCP office tomorrow. 04/10/2020 6:11:55 PM Clinical Call Yes Berna Bue, RN, Aundra Millet [name] Disagree/Comply Comply [name] Understands Yes PreDisposition Call Doctor

## 2020-04-11 NOTE — Telephone Encounter (Signed)
[facility] Night - Client TELEPHONE ADVICE RECORD AccessNurse Patient Name: Alice Page Gender: Female DOB: 04/17/1966 Age: 53 Y 10 M 6 D Return Phone Number: [phone] (Primary) Address: City/State/Zip: [place] [address] Client [facility] Night - Client Client Site  [facility] - Night Physician Tower, Marne - MD Contact Type Call Who Is Calling Patient / Member / Family / Caregiver Call Type Triage / Clinical Relationship To Patient Self Return Phone Number [phone] (Primary) Chief Complaint Back Pain - General Reason for Call Symptomatic / Request for Health Information Initial Comment [name] states a MRI was ordered for her back and is in need of pain medication. [name] states she would like to know what she needs to do in order to get refills on her pain medication. Translation No Nurse Assessment Nurse: Cochran, RN, Megan Date/Time (Eastern Time): 04/10/2020 6:06:14 PM Please select the assessment type ---Refill Additional Documentation ---[name] states she has been having back pain. She had an MRI. She is going to need back surgery. [name] is requesting a refill of Percocet 5-325mg [facility] [address] @ [phone] Does the patient have enough medication to last until the office opens? ---Yes Guidelines Guideline Title Affirmed Question Affirmed Notes Nurse Date/Time (Eastern Time) Disp. Time (Eastern Time) Disposition Final User 04/10/2020 6:11:47 PM Pharmacy Call Cochran, RN, Megan Reason: Patient has enough medication tonight. [name] will call back to PCP office tomorrow. 04/10/2020 6:11:55 PM Clinical Call Yes Cochran, RN, Megan [name] Disagree/Comply Comply [name] Understands Yes PreDisposition Call Doctor 

## 2020-04-11 NOTE — Telephone Encounter (Signed)
Thanks for the update

## 2020-04-11 NOTE — Telephone Encounter (Signed)
Refills sent Hope she is doing ok  Would you please ask her if she has a neuro surg appt date yet?

## 2020-04-13 DIAGNOSIS — M5116 Intervertebral disc disorders with radiculopathy, lumbar region: Secondary | ICD-10-CM | POA: Diagnosis not present

## 2020-04-13 DIAGNOSIS — M5126 Other intervertebral disc displacement, lumbar region: Secondary | ICD-10-CM | POA: Diagnosis not present

## 2020-05-10 ENCOUNTER — Encounter: Payer: Self-pay | Admitting: Family Medicine

## 2020-05-11 MED ORDER — HYDROCORTISONE (PERIANAL) 2.5 % EX CREA: 1.0000 "application " | TOPICAL_CREAM | Freq: Two times a day (BID) | CUTANEOUS | 0 refills | Status: DC

## 2020-05-11 NOTE — Telephone Encounter (Signed)
anusol hc sent

## 2020-06-08 DIAGNOSIS — M545 Low back pain: Secondary | ICD-10-CM | POA: Diagnosis not present

## 2020-06-12 DIAGNOSIS — M545 Low back pain: Secondary | ICD-10-CM | POA: Diagnosis not present

## 2020-06-14 DIAGNOSIS — M545 Low back pain: Secondary | ICD-10-CM | POA: Diagnosis not present

## 2020-06-19 DIAGNOSIS — M545 Low back pain: Secondary | ICD-10-CM | POA: Diagnosis not present

## 2020-06-21 DIAGNOSIS — M545 Low back pain: Secondary | ICD-10-CM | POA: Diagnosis not present

## 2020-06-28 DIAGNOSIS — M545 Low back pain: Secondary | ICD-10-CM | POA: Diagnosis not present

## 2020-07-03 DIAGNOSIS — M545 Low back pain: Secondary | ICD-10-CM | POA: Diagnosis not present

## 2020-07-05 DIAGNOSIS — M545 Low back pain: Secondary | ICD-10-CM | POA: Diagnosis not present

## 2020-07-12 DIAGNOSIS — M545 Low back pain: Secondary | ICD-10-CM | POA: Diagnosis not present

## 2020-07-14 DIAGNOSIS — M545 Low back pain: Secondary | ICD-10-CM | POA: Diagnosis not present

## 2020-07-18 DIAGNOSIS — M545 Low back pain: Secondary | ICD-10-CM | POA: Diagnosis not present

## 2020-07-21 DIAGNOSIS — M545 Low back pain, unspecified: Secondary | ICD-10-CM | POA: Diagnosis not present

## 2020-07-24 DIAGNOSIS — M545 Low back pain, unspecified: Secondary | ICD-10-CM | POA: Diagnosis not present

## 2020-07-27 DIAGNOSIS — M545 Low back pain, unspecified: Secondary | ICD-10-CM | POA: Diagnosis not present

## 2020-08-04 DIAGNOSIS — H5310 Unspecified subjective visual disturbances: Secondary | ICD-10-CM | POA: Diagnosis not present

## 2020-08-08 DIAGNOSIS — M545 Low back pain, unspecified: Secondary | ICD-10-CM | POA: Diagnosis not present

## 2020-08-08 DIAGNOSIS — M5416 Radiculopathy, lumbar region: Secondary | ICD-10-CM | POA: Diagnosis not present

## 2020-08-15 DIAGNOSIS — M545 Low back pain, unspecified: Secondary | ICD-10-CM | POA: Diagnosis not present

## 2020-08-22 DIAGNOSIS — M545 Low back pain, unspecified: Secondary | ICD-10-CM | POA: Diagnosis not present

## 2020-08-29 DIAGNOSIS — M545 Low back pain, unspecified: Secondary | ICD-10-CM | POA: Diagnosis not present

## 2020-09-04 DIAGNOSIS — L821 Other seborrheic keratosis: Secondary | ICD-10-CM | POA: Diagnosis not present

## 2020-09-04 DIAGNOSIS — L570 Actinic keratosis: Secondary | ICD-10-CM | POA: Diagnosis not present

## 2020-09-04 DIAGNOSIS — D2371 Other benign neoplasm of skin of right lower limb, including hip: Secondary | ICD-10-CM | POA: Diagnosis not present

## 2020-09-04 DIAGNOSIS — X32XXXA Exposure to sunlight, initial encounter: Secondary | ICD-10-CM | POA: Diagnosis not present

## 2020-09-05 DIAGNOSIS — M545 Low back pain, unspecified: Secondary | ICD-10-CM | POA: Diagnosis not present

## 2020-09-12 DIAGNOSIS — M545 Low back pain, unspecified: Secondary | ICD-10-CM | POA: Diagnosis not present

## 2020-09-19 DIAGNOSIS — M545 Low back pain, unspecified: Secondary | ICD-10-CM | POA: Diagnosis not present

## 2020-10-30 DIAGNOSIS — H43813 Vitreous degeneration, bilateral: Secondary | ICD-10-CM | POA: Diagnosis not present

## 2020-10-30 DIAGNOSIS — H35372 Puckering of macula, left eye: Secondary | ICD-10-CM | POA: Diagnosis not present

## 2020-11-02 DIAGNOSIS — H35372 Puckering of macula, left eye: Secondary | ICD-10-CM | POA: Diagnosis not present

## 2020-11-14 DIAGNOSIS — H40023 Open angle with borderline findings, high risk, bilateral: Secondary | ICD-10-CM | POA: Diagnosis not present

## 2020-11-14 DIAGNOSIS — H2513 Age-related nuclear cataract, bilateral: Secondary | ICD-10-CM | POA: Diagnosis not present

## 2020-11-14 DIAGNOSIS — H43823 Vitreomacular adhesion, bilateral: Secondary | ICD-10-CM | POA: Diagnosis not present

## 2020-11-23 ENCOUNTER — Encounter: Payer: Self-pay | Admitting: Family Medicine

## 2020-12-31 IMAGING — CT CT PELVIS W/O CM
3 series · 14 of 35 positions shown, 17 images · non-contrast
Comparison: Plain films of the sacrum today.

CLINICAL DATA: Right hip and leg pain for 2 weeks after a fall.
Symptoms worsened 2 days ago. Initial encounter.

EXAM:
CT OF THE PELVIS WITHOUT CONTRAST
TECHNIQUE: Multidetector CT imaging of the pelvis was performed according to
the standard protocol.

[Series 4: axial st · axial · 0.70mm/px · z∈[-418,-242]mm · 6 of 115 slices shown, 8 images]
[im 18/115  soft-tissue]
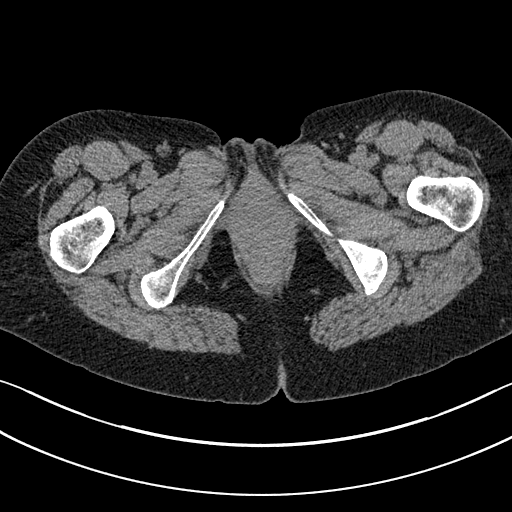
[im 18/115  bone]
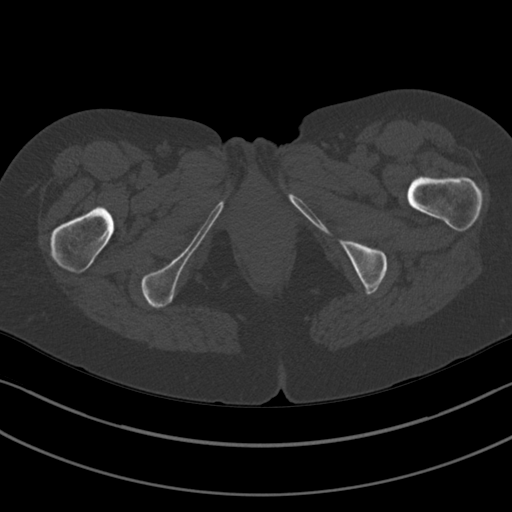
[im 36/115  bone]
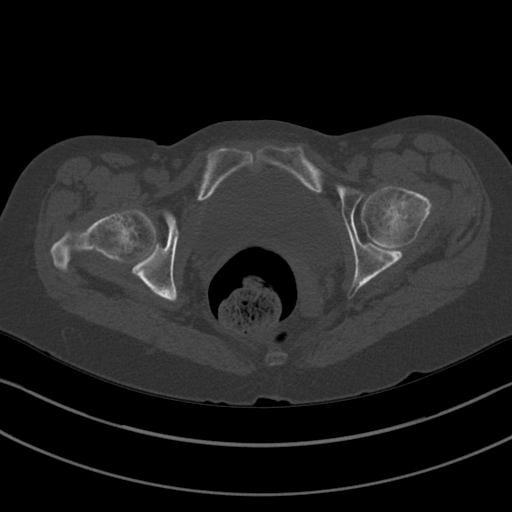
[im 53/115  bone]
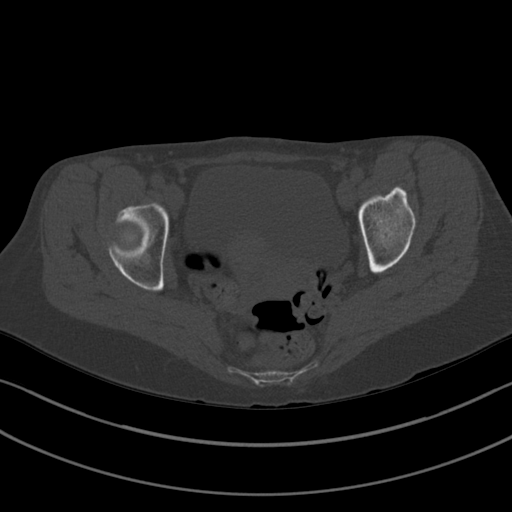
[im 71/115  bone]
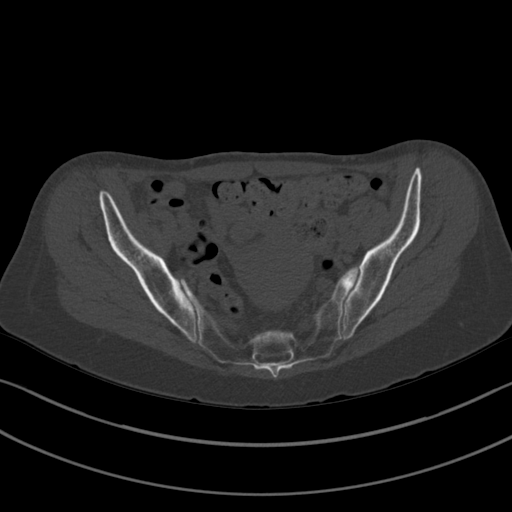
[im 88/115  soft-tissue]
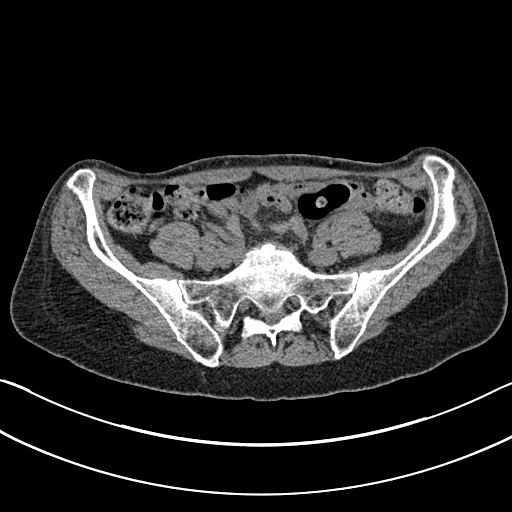
[im 88/115  bone]
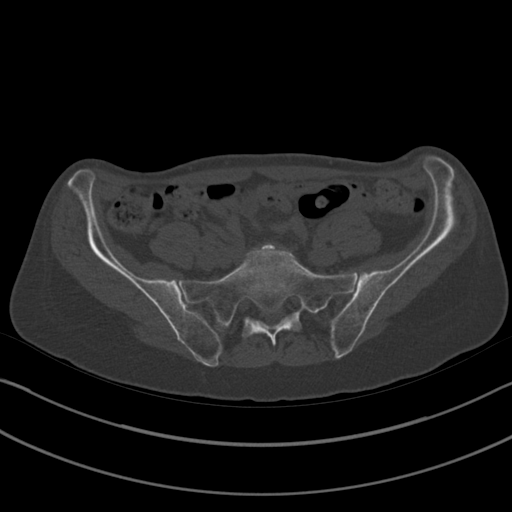
[im 106/115  bone]
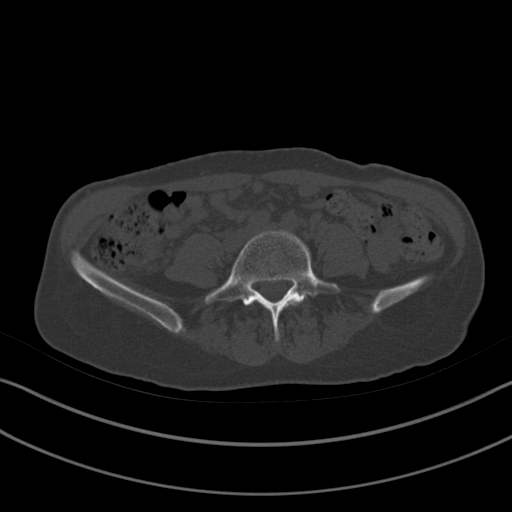

[Series 9: coronal st · coronal · 0.46mm/px · 3 of 95 slices shown]
[im 19/95  bone]
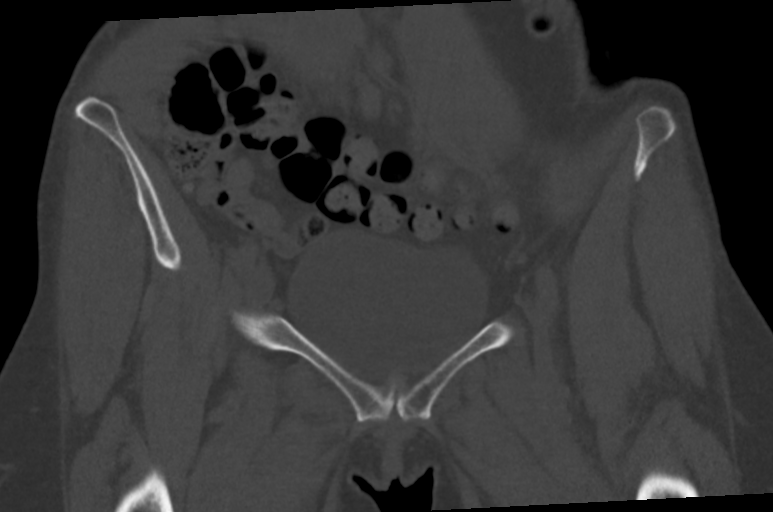
[im 38/95  bone]
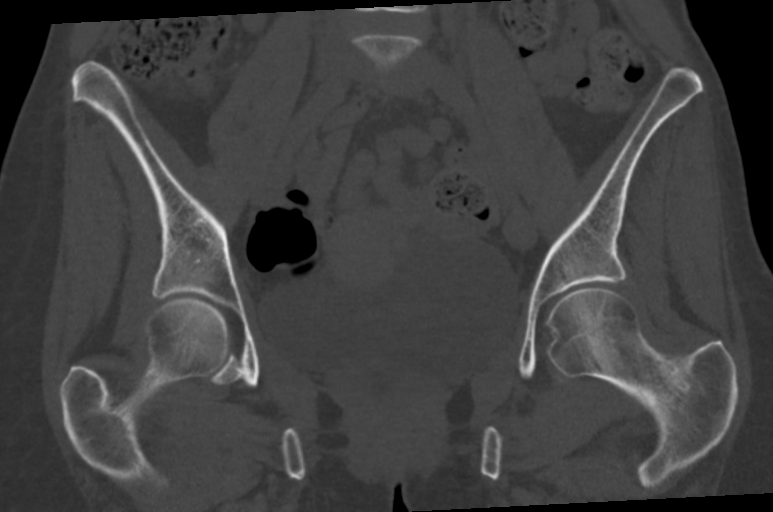
[im 57/95  bone]
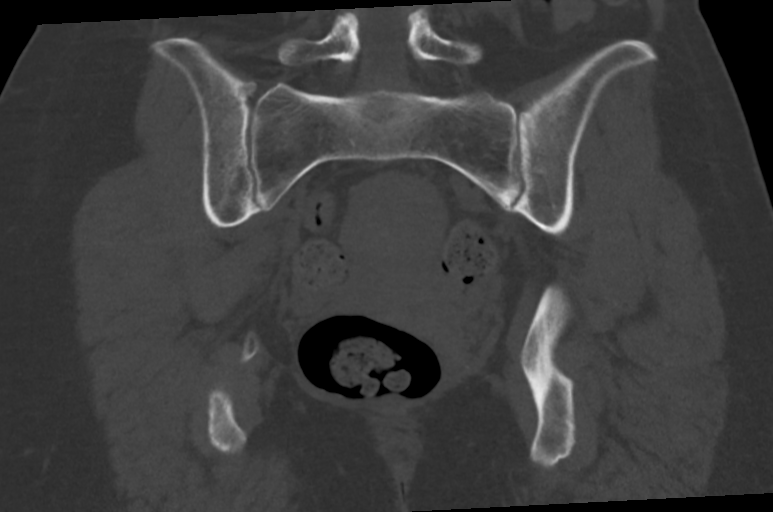

[Series 10: sagittal st · sagittal · 0.37mm/px · 5 of 177 slices shown, 6 images]
[im 59/177  bone]
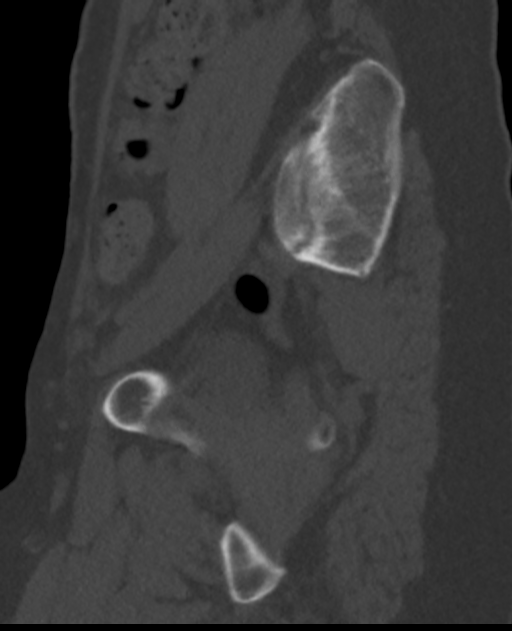
[im 74/177  bone]
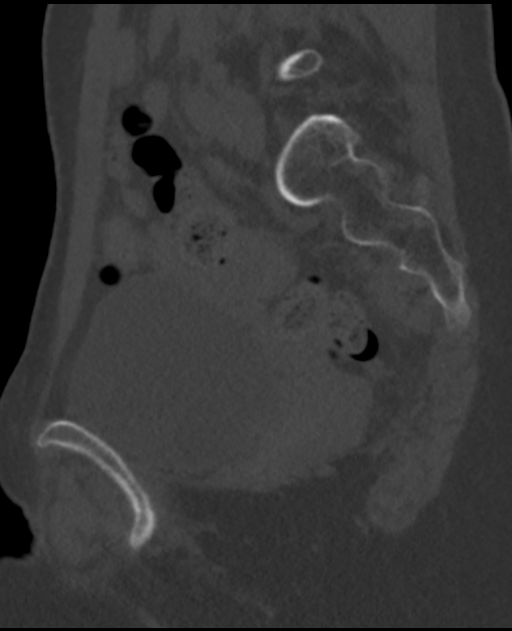
[im 89/177  soft-tissue]
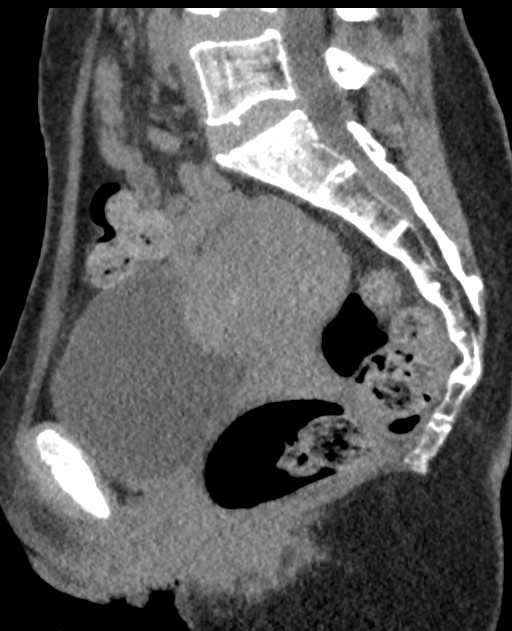
[im 89/177  bone]
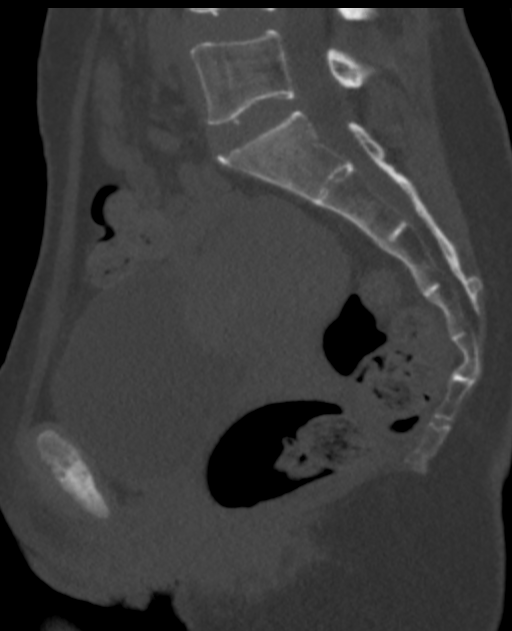
[im 103/177  bone]
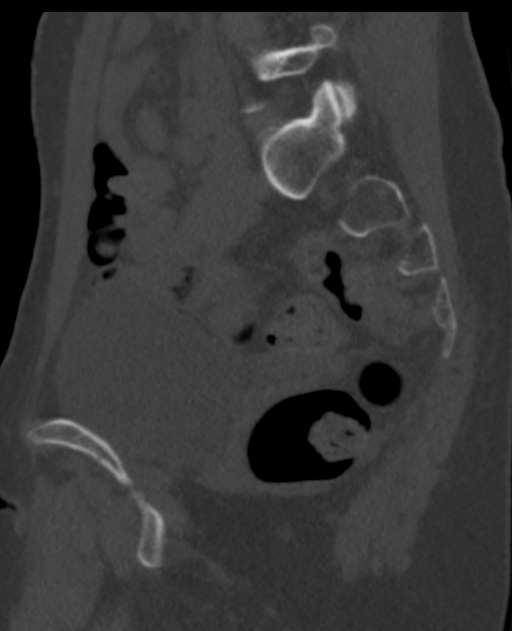
[im 118/177  bone]
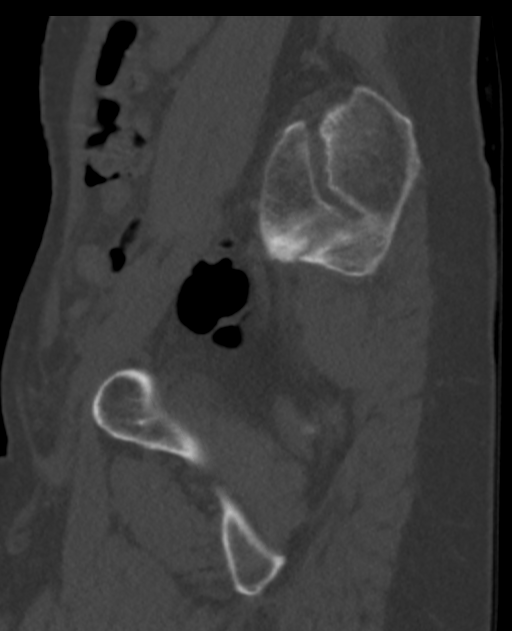

[14 of 35 positions shown; findings below may reference images not displayed]

FINDINGS: Bones/Joint/Cartilage

There is no acute or focal bony abnormality. No avascular necrosis
of the femoral heads. Mild degenerative change about the SI joints
and symphysis pubis noted. The hips appear normal.

Ligaments

Suboptimally assessed by CT.

Muscles and Tendons

Appear normal.

Soft tissues

3.7 cm uterine fibroid on the right is identified. Imaged
intrapelvic contents are otherwise unremarkable.
IMPRESSION: No acute abnormality or finding to explain the patient's symptoms.

Mild degenerative disease at the symphysis pubis and about the SI
joints.

3.7 cm uterine fibroid.

## 2021-01-16 ENCOUNTER — Telehealth: Payer: Self-pay | Admitting: Family Medicine

## 2021-01-16 DIAGNOSIS — Z0000 Encounter for general adult medical examination without abnormal findings: Secondary | ICD-10-CM

## 2021-01-16 DIAGNOSIS — R7309 Other abnormal glucose: Secondary | ICD-10-CM

## 2021-01-16 NOTE — Telephone Encounter (Signed)
-----   Message from Aquilla Solian, RT sent at 01/01/2021 10:03 AM EDT ----- Regarding: Lab Orders for Wednesday 3.30.2022 Please place lab orders for  Wednesday 3.30.2022, office visit for physical on Tuesday 4.5.2022 Thank you, Jones Bales RT(R)

## 2021-01-17 ENCOUNTER — Other Ambulatory Visit (INDEPENDENT_AMBULATORY_CARE_PROVIDER_SITE_OTHER): Payer: BC Managed Care – PPO

## 2021-01-17 ENCOUNTER — Other Ambulatory Visit: Payer: Self-pay

## 2021-01-17 DIAGNOSIS — R7309 Other abnormal glucose: Secondary | ICD-10-CM

## 2021-01-17 DIAGNOSIS — Z0000 Encounter for general adult medical examination without abnormal findings: Secondary | ICD-10-CM

## 2021-01-17 LAB — CBC WITH DIFFERENTIAL/PLATELET
Basophils Absolute: 0 10*3/uL (ref 0.0–0.1)
Basophils Relative: 0.5 % (ref 0.0–3.0)
Eosinophils Absolute: 0.2 10*3/uL (ref 0.0–0.7)
Eosinophils Relative: 3.7 % (ref 0.0–5.0)
HCT: 38.5 % (ref 36.0–46.0)
Hemoglobin: 13.1 g/dL (ref 12.0–15.0)
Lymphocytes Relative: 30.5 % (ref 12.0–46.0)
Lymphs Abs: 1.3 10*3/uL (ref 0.7–4.0)
MCHC: 34 g/dL (ref 30.0–36.0)
MCV: 89.8 fl (ref 78.0–100.0)
Monocytes Absolute: 0.4 10*3/uL (ref 0.1–1.0)
Monocytes Relative: 9 % (ref 3.0–12.0)
Neutro Abs: 2.4 10*3/uL (ref 1.4–7.7)
Neutrophils Relative %: 56.3 % (ref 43.0–77.0)
Platelets: 187 10*3/uL (ref 150.0–400.0)
RBC: 4.29 Mil/uL (ref 3.87–5.11)
RDW: 12.6 % (ref 11.5–15.5)
WBC: 4.2 10*3/uL (ref 4.0–10.5)

## 2021-01-17 LAB — COMPREHENSIVE METABOLIC PANEL
ALT: 20 U/L (ref 0–35)
AST: 20 U/L (ref 0–37)
Albumin: 4.3 g/dL (ref 3.5–5.2)
Alkaline Phosphatase: 38 U/L — ABNORMAL LOW (ref 39–117)
BUN: 22 mg/dL (ref 6–23)
CO2: 29 mEq/L (ref 19–32)
Calcium: 9.8 mg/dL (ref 8.4–10.5)
Chloride: 102 mEq/L (ref 96–112)
Creatinine, Ser: 0.7 mg/dL (ref 0.40–1.20)
GFR: 97.23 mL/min (ref >60.00)
Glucose, Bld: 108 mg/dL — ABNORMAL HIGH (ref 70–99)
Potassium: 4.3 mEq/L (ref 3.5–5.1)
Sodium: 138 mEq/L (ref 135–145)
Total Bilirubin: 0.8 mg/dL (ref 0.2–1.2)
Total Protein: 7.2 g/dL (ref 6.0–8.3)

## 2021-01-17 LAB — LIPID PANEL
Cholesterol: 193 mg/dL (ref 0–200)
HDL: 89.8 mg/dL (ref >39.00)
LDL Cholesterol: 92 mg/dL (ref 0–99)
NonHDL: 103.15
Total CHOL/HDL Ratio: 2
Triglycerides: 55 mg/dL (ref 0.0–149.0)
VLDL: 11 mg/dL (ref 0.0–40.0)

## 2021-01-17 LAB — HEMOGLOBIN A1C: Hgb A1c MFr Bld: 5.5 % (ref 4.6–6.5)

## 2021-01-17 LAB — TSH: TSH: 3.02 u[IU]/mL (ref 0.35–4.50)

## 2021-01-23 ENCOUNTER — Ambulatory Visit (INDEPENDENT_AMBULATORY_CARE_PROVIDER_SITE_OTHER): Payer: BC Managed Care – PPO | Admitting: Family Medicine

## 2021-01-23 ENCOUNTER — Encounter: Payer: Self-pay | Admitting: Family Medicine

## 2021-01-23 ENCOUNTER — Other Ambulatory Visit: Payer: Self-pay

## 2021-01-23 VITALS — BP 138/86 | HR 83 | Temp 97.0°F | Ht 67.75 in | Wt 138.1 lb

## 2021-01-23 DIAGNOSIS — R7309 Other abnormal glucose: Secondary | ICD-10-CM

## 2021-01-23 DIAGNOSIS — Z0000 Encounter for general adult medical examination without abnormal findings: Secondary | ICD-10-CM | POA: Diagnosis not present

## 2021-01-23 DIAGNOSIS — R053 Chronic cough: Secondary | ICD-10-CM

## 2021-01-23 DIAGNOSIS — Z1211 Encounter for screening for malignant neoplasm of colon: Secondary | ICD-10-CM | POA: Diagnosis not present

## 2021-01-23 DIAGNOSIS — M5416 Radiculopathy, lumbar region: Secondary | ICD-10-CM | POA: Diagnosis not present

## 2021-01-23 DIAGNOSIS — R1909 Other intra-abdominal and pelvic swelling, mass and lump: Secondary | ICD-10-CM | POA: Insufficient documentation

## 2021-01-23 NOTE — Assessment & Plan Note (Signed)
Improvement s/p lumbar laminectomy in the fall  Still working on getting back to baseline and has done PT

## 2021-01-23 NOTE — Assessment & Plan Note (Signed)
R side ever since her back surgery  No tenderness or mass on exam  Not reducible and it does not increase with valsalva  Discussed possibility of inflammation vs hernia Pt declines surgical referral unless symptoms worsen or fail to improve

## 2021-01-23 NOTE — Assessment & Plan Note (Signed)
Reviewed health habits including diet and exercise and skin cancer prevention Reviewed appropriate screening tests for age  Also reviewed health mt list, fam hx and immunization status , as well as social and family history   See HPI Labs reviewed  Had covid and declines vaccination  Considering shingrix if covered (questions answered) Declines colonoscopy  Sent for mammogram report from 1/22  utd pap from gyn with neg hpv

## 2021-01-23 NOTE — Assessment & Plan Note (Signed)
Declines colonoscopy at this time

## 2021-01-23 NOTE — Assessment & Plan Note (Signed)
Lab Results  Component Value Date   HGBA1C 5.5 01/17/2021   Well controlled with low glycemic diet

## 2021-01-23 NOTE — Progress Notes (Signed)
Subjective:    Patient ID: Alice Page, female    DOB: January 16, 1966, 55 y.o.   MRN: [id number]  This visit occurred during the SARS-CoV-2 public health emergency.  Safety protocols were in place, including screening questions prior to the visit, additional usage of staff PPE, and extensive cleaning of exam room while observing appropriate contact time as indicated for disinfecting solutions.    HPI Here for health maintenance exam and to review chronic medical problems   Wt Readings from Last 3 Encounters:  01/23/21 138 lb 1 oz (62.6 kg)  04/07/20 134 lb 6 oz (61 kg)  04/06/20 130 lb (59 kg)   21.15 kg/m   Doing ok overall  Back bothers her a lot -gradually getting better (lumbar laminectomy in the fall) Walking for exercise - doing it a lot  Was walking up to 8-9 mi per day at one point, now 3-4 mi per day    Tdap 3/17 covid status - had covid - got through it w/o problems  Zoster status - wants to get but afraid of it / deciding   Colon cancer screening  Neg ifob kit 7/20  Mammogram  Has a mammogram machine in gyn office January 2022 Dr Marvel Plan Self breast exam -no lumps   Pap 1/20 neg at gyn with neg HPV Menses -done/ no breakthrough  Some hot flashes -not too bad   BP Readings from Last 3 Encounters:  01/23/21 138/86  04/07/20 130/64  04/06/20 114/68   Pulse Readings from Last 3 Encounters:  01/23/21 83  04/07/20 67  04/06/20 (!) 55    H/o elevated glucose Lab Results  Component Value Date   HGBA1C 5.5 01/17/2021  stable  Lab Results  Component Value Date   CREATININE 0.70 01/17/2021   BUN 22 01/17/2021   NA 138 01/17/2021   K 4.3 01/17/2021   CL 102 01/17/2021   CO2 29 01/17/2021   Lab Results  Component Value Date   ALT 20 01/17/2021   AST 20 01/17/2021   ALKPHOS 38 (L) 01/17/2021   BILITOT 0.8 01/17/2021   Lab Results  Component Value Date   WBC 4.2 01/17/2021   HGB 13.1 01/17/2021   HCT 38.5 01/17/2021   MCV 89.8 01/17/2021    PLT 187.0 01/17/2021   Cholesterol  Lab Results  Component Value Date   CHOL 193 01/17/2021   CHOL 187 04/07/2019   CHOL 189 01/05/2018   Lab Results  Component Value Date   HDL 89.80 01/17/2021   HDL 81.40 04/07/2019   HDL 88.60 01/05/2018   Lab Results  Component Value Date   LDLCALC 92 01/17/2021   LDLCALC 93 04/07/2019   LDLCALC 85 01/05/2018   Lab Results  Component Value Date   TRIG 55.0 01/17/2021   TRIG 65.0 04/07/2019   TRIG 76.0 01/05/2018   Lab Results  Component Value Date   CHOLHDL 2 01/17/2021   CHOLHDL 2 04/07/2019   CHOLHDL 2 01/05/2018   No results found for: LDLDIRECT Very good cholesterol   Has a swollen area in R groin (not painful and not reducible) since her back surgery in June 2021  Has chronic phlegm in throat in the am (years and years -long before covid)  Thick chunk of mucous when she can spit it out occasionally  No heartburn/acid reflux  She tried pepcid at night and it did not help  It annoys her  Burps more also (? Age related)  No dysphagia   Patient Active  Problem List   Diagnosis Date Noted  . Lumbar back pain with radiculopathy affecting right lower extremity 04/07/2020  . Disc disorder of lumbar region 04/07/2020  . Chronic cough 04/21/2019  . Tail bone pain 04/21/2019  . Simple abscess 04/20/2019  . Elevated glucose level 01/12/2018  . Immunization reaction 01/26/2016  . Colon cancer screening 12/19/2015  . Routine general medical examination at a health care facility 12/07/2015  . Joint pain 05/16/2014  . Migraine without aura, not refractory 07/31/2012  . SYNCOPE 10/05/2007  . INCREASED INTRAOCULAR PRESSURE 07/21/2007  . Allergic rhinitis 07/21/2007   Past Medical History:  Diagnosis Date  . Anemia   . Heavy menses   . History of allergic rhinitis   . History of recurrent UTIs    Past Surgical History:  Procedure Laterality Date  . LUMBAR LAMINECTOMY     Dr Arnoldo Morale   Social History   Tobacco Use   . Smoking status: Never Smoker  . Smokeless tobacco: Never Used  Substance Use Topics  . Alcohol use: No    Alcohol/week: 0.0 standard drinks  . Drug use: No   History reviewed. No pertinent family history. Allergies  Allergen Reactions  . Cetirizine Hcl     REACTION: sedation  . Ciprofloxacin     REACTION: joint pain  . Tdap [Tetanus-Diphth-Acell Pertussis] Other (See Comments)    Arm pain   Current Outpatient Medications on File Prior to Visit  Medication Sig Dispense Refill  . Aspirin-Acetaminophen-Caffeine (EXCEDRIN PO) Take by mouth as needed.    . fexofenadine (ALLEGRA) 180 MG tablet Take 180 mg by mouth daily as needed for allergies or rhinitis.    . hydrocortisone (ANUSOL-HC) 2.5 % rectal cream Place 1 application rectally 2 (two) times daily. (Patient not taking: Reported on 01/23/2021) 30 g 0   No current facility-administered medications on file prior to visit.     Review of Systems  Constitutional: Positive for fatigue. Negative for activity change, appetite change, fever and unexpected weight change.  HENT: Negative for congestion, ear pain, rhinorrhea, sinus pressure and sore throat.        Clears throat in am, globus sensation    Eyes: Negative for pain, redness and visual disturbance.  Respiratory: Negative for cough, shortness of breath and wheezing.   Cardiovascular: Negative for chest pain and palpitations.  Gastrointestinal: Negative for abdominal pain, blood in stool, constipation and diarrhea.       Burping   Endocrine: Negative for polydipsia and polyuria.  Genitourinary: Negative for dysuria, frequency and urgency.       Swollen area in groin    Musculoskeletal: Positive for back pain. Negative for arthralgias and myalgias.  Skin: Negative for pallor and rash.  Allergic/Immunologic: Negative for environmental allergies.  Neurological: Negative for dizziness, syncope and headaches.  Hematological: Negative for adenopathy. Does not bruise/bleed  easily.  Psychiatric/Behavioral: Negative for decreased concentration and dysphoric mood. The patient is not nervous/anxious.        Objective:   Physical Exam Constitutional:      General: She is not in acute distress.    Appearance: Normal appearance. She is well-developed and normal weight. She is not ill-appearing or diaphoretic.  HENT:     Head: Normocephalic and atraumatic.     Right Ear: Tympanic membrane, ear canal and external ear normal.     Left Ear: Tympanic membrane, ear canal and external ear normal.     Nose: Nose normal. No congestion.     Mouth/Throat:  Mouth: Mucous membranes are moist.     Pharynx: Oropharynx is clear. No posterior oropharyngeal erythema.  Eyes:     General: No scleral icterus.    Extraocular Movements: Extraocular movements intact.     Conjunctiva/sclera: Conjunctivae normal.     Pupils: Pupils are equal, round, and reactive to light.  Neck:     Thyroid: No thyromegaly.     Vascular: No carotid bruit or JVD.  Cardiovascular:     Rate and Rhythm: Normal rate and regular rhythm.     Pulses: Normal pulses.     Heart sounds: Normal heart sounds. No gallop.   Pulmonary:     Effort: Pulmonary effort is normal. No respiratory distress.     Breath sounds: Normal breath sounds. No wheezing.     Comments: Good air exch Chest:     Chest wall: No tenderness.  Abdominal:     General: Bowel sounds are normal. There is no distension or abdominal bruit.     Palpations: Abdomen is soft. There is no mass.     Tenderness: There is no abdominal tenderness.     Hernia: No hernia is present.     Comments: Slight fullness of R groin area w/o palpable mass No change with valsalva No tenderness or skin change   Genitourinary:    Comments: Breast exam: No mass, nodules, thickening, tenderness, bulging, retraction, inflamation, nipple discharge or skin changes noted.  No axillary or clavicular LA.     Musculoskeletal:        General: No tenderness. Normal  range of motion.     Cervical back: Normal range of motion and neck supple. No rigidity. No muscular tenderness.     Right lower leg: No edema.     Left lower leg: No edema.     Comments: No kyphosis  Healing scar midline lower back   Solar lentigines diffusely   Lymphadenopathy:     Cervical: No cervical adenopathy.  Skin:    General: Skin is warm and dry.     Coloration: Skin is not pale.     Findings: No erythema or rash.  Neurological:     Mental Status: She is alert. Mental status is at baseline.     Cranial Nerves: No cranial nerve deficit.     Motor: No abnormal muscle tone.     Coordination: Coordination normal.     Gait: Gait normal.     Deep Tendon Reflexes: Reflexes are normal and symmetric. Reflexes normal.  Psychiatric:        Mood and Affect: Mood normal.        Cognition and Memory: Cognition and memory normal.           Assessment & Plan:   Problem List Items Addressed This Visit      Nervous and Auditory   Lumbar back pain with radiculopathy affecting right lower extremity    Improvement s/p lumbar laminectomy in the fall  Still working on getting back to baseline and has done PT        Other   Routine general medical examination at a health care facility - Primary    Reviewed health habits including diet and exercise and skin cancer prevention Reviewed appropriate screening tests for age  Also reviewed health mt list, fam hx and immunization status , as well as social and family history   See HPI Labs reviewed  Had covid and declines vaccination  Considering shingrix if covered (questions answered) Declines colonoscopy  Sent  for mammogram report from 1/22  utd pap from gyn with neg hpv       Colon cancer screening    Declines colonoscopy at this time      Elevated glucose level    Lab Results  Component Value Date   HGBA1C 5.5 01/17/2021   Well controlled with low glycemic diet       Chronic cough    Pt has mucous in throat every  am and has to clear throat repeatedly to get it out No imp with H2 blocker Taking allegra  Suggested vaporizer in bedroom  Continue to monitor  Denies sob or chest discomfort or n/v

## 2021-01-23 NOTE — Assessment & Plan Note (Signed)
Pt has mucous in throat every am and has to clear throat repeatedly to get it out No imp with H2 blocker Taking allegra  Suggested vaporizer in bedroom  Continue to monitor  Denies sob or chest discomfort or n/v

## 2021-01-23 NOTE — Patient Instructions (Addendum)
If you are interested in the shingles vaccine series (Shingrix), call your insurance or pharmacy to check on coverage and location it must be given.  If affordable - you can schedule it here or at your pharmacy depending on coverage   Watch the swollen area in right groin  If it enlarges or hurts or fails to improve in the coming 3 months then let us know  Keep walking  Drink fluids Labs look good   Watch for heartburn  You could try a different antihistamine like xyzal instead of allegra to see if this helps the phlegm in throat  Also can try a wedge in bed or lift head of bed  Try a vaporizer in your bedroom  We can send you to ENT provider if needed

## 2021-01-24 ENCOUNTER — Encounter: Payer: Self-pay | Admitting: Family Medicine

## 2021-01-24 MED ORDER — HYDROCORTISONE (PERIANAL) 2.5 % EX CREA: 1.0000 "application " | TOPICAL_CREAM | Freq: Two times a day (BID) | CUTANEOUS | 0 refills | Status: DC

## 2021-03-23 DIAGNOSIS — H43813 Vitreous degeneration, bilateral: Secondary | ICD-10-CM | POA: Diagnosis not present

## 2021-03-23 DIAGNOSIS — H5310 Unspecified subjective visual disturbances: Secondary | ICD-10-CM | POA: Diagnosis not present

## 2021-04-05 ENCOUNTER — Other Ambulatory Visit: Payer: Self-pay

## 2021-04-05 ENCOUNTER — Encounter: Payer: Self-pay | Admitting: Family Medicine

## 2021-04-05 ENCOUNTER — Ambulatory Visit: Payer: BC Managed Care – PPO | Admitting: Family Medicine

## 2021-04-05 VITALS — BP 122/86 | HR 65 | Temp 98.0°F | Ht 67.25 in | Wt 139.0 lb

## 2021-04-05 DIAGNOSIS — R350 Frequency of micturition: Secondary | ICD-10-CM | POA: Diagnosis not present

## 2021-04-05 DIAGNOSIS — N3000 Acute cystitis without hematuria: Secondary | ICD-10-CM | POA: Insufficient documentation

## 2021-04-05 LAB — POCT URINALYSIS DIP (MANUAL ENTRY)
Bilirubin, UA: NEGATIVE
Blood, UA: NEGATIVE
Glucose, UA: NEGATIVE mg/dL
Ketones, POC UA: NEGATIVE mg/dL
Leukocytes, UA: NEGATIVE
Nitrite, UA: NEGATIVE
Protein Ur, POC: NEGATIVE mg/dL
Spec Grav, UA: <=1.005 — AB (ref 1.010–1.025)
Urobilinogen, UA: 0.2 E.U./dL
pH, UA: 7 (ref 5.0–8.0)

## 2021-04-05 MED ORDER — NITROFURANTOIN MONOHYD MACRO 100 MG PO CAPS: 100.0000 mg | ORAL_CAPSULE | Freq: Two times a day (BID) | ORAL | 0 refills | Status: DC

## 2021-04-05 NOTE — Patient Instructions (Signed)
Keep drinking lots of water  Take the generic macrobid  If symptoms worsen alert [place]  We will contact you with a culture result

## 2021-04-05 NOTE — Assessment & Plan Note (Signed)
Much improvement after one dose of macrobid and also azo use  ua clear (due to above) Px 7 d of macrobid and enc water intake  Culture pending Update if not starting to improve in a week or if worsening   Handout on uti given

## 2021-04-05 NOTE — Progress Notes (Signed)
Subjective:    Patient ID: Alice Page, female    DOB: August 29, 1966, 55 y.o.   MRN: [id number]  This visit occurred during the SARS-CoV-2 public health emergency.  Safety protocols were in place, including screening questions prior to the visit, additional usage of staff PPE, and extensive cleaning of exam room while observing appropriate contact time as indicated for disinfecting solutions.   HPI Pt presents for urinary symptoms   Uti symptoms  Frequency and urgency and dysuria    Tuesday- burned to urinate Yesterday- worse and also afterwards No bladder pain   Yesterday she took AZO That initially helped but by last night worse again   Frequency and urgency  No flank pain  No new low back pain    Took some macrobid she had on hand (exp in 2018)  Took one last evening Much better today   Results for orders placed or performed in visit on 04/05/21  POCT urinalysis dipstick  Result Value Ref Range   Color, UA yellow yellow   Clarity, UA clear clear   Glucose, UA negative negative mg/dL   Bilirubin, UA negative negative   Ketones, POC UA negative negative mg/dL   Spec Grav, UA <=7.124 (A) 1.010 - 1.025   Blood, UA negative negative   pH, UA 7.0 5.0 - 8.0   Protein Ur, POC negative negative mg/dL   Urobilinogen, UA 0.2 0.2 or 1.0 E.U./dL   Nitrite, UA Negative Negative   Leukocytes, UA Negative Negative    [place] a [place] prior   Patient Active Problem List   Diagnosis Date Noted   Acute cystitis 04/05/2021   Groin swelling 01/23/2021   Lumbar back pain with radiculopathy affecting right lower extremity 04/07/2020   Disc disorder of lumbar region 04/07/2020   Chronic cough 04/21/2019   Tail bone pain 04/21/2019   Simple abscess 04/20/2019   Elevated glucose level 01/12/2018   Immunization reaction 01/26/2016   Colon cancer screening 12/19/2015   Routine general medical examination at a health care facility 12/07/2015   Joint pain 05/16/2014    Migraine without aura, not refractory 07/31/2012   SYNCOPE 10/05/2007   INCREASED INTRAOCULAR PRESSURE 07/21/2007   Allergic rhinitis 07/21/2007   Past Medical History:  Diagnosis Date   Anemia    Heavy menses    History of allergic rhinitis    History of recurrent UTIs    Past Surgical History:  Procedure Laterality Date   LUMBAR LAMINECTOMY     Dr Lovell Sheehan   Social History   Tobacco Use   Smoking status: Never   Smokeless tobacco: Never  Substance Use Topics   Alcohol use: No    Alcohol/week: 0.0 standard drinks   Drug use: No   History reviewed. No pertinent family history. Allergies  Allergen Reactions   Cetirizine Hcl     REACTION: sedation   Ciprofloxacin     REACTION: joint pain   Tdap [Tetanus-Diphth-Acell Pertussis] Other (See Comments)    Arm pain   Current Outpatient Medications on File Prior to Visit  Medication Sig Dispense Refill   Aspirin-Acetaminophen-Caffeine (EXCEDRIN PO) Take by mouth as needed.     fexofenadine (ALLEGRA) 180 MG tablet Take 180 mg by mouth daily as needed for allergies or rhinitis.     hydrocortisone (ANUSOL-HC) 2.5 % rectal cream Place 1 application rectally 2 (two) times daily. 30 g 0   No current facility-administered medications on file prior to visit.    Review of Systems  Constitutional:  Negative for activity change, appetite change, fatigue, fever and unexpected weight change.  HENT:  Negative for congestion, ear pain, rhinorrhea, sinus pressure and sore throat.   Eyes:  Negative for pain, redness and visual disturbance.  Respiratory:  Negative for cough, shortness of breath and wheezing.   Cardiovascular:  Negative for chest pain and palpitations.  Gastrointestinal:  Negative for abdominal pain, blood in stool, constipation and diarrhea.  Endocrine: Negative for polydipsia and polyuria.  Genitourinary:  Positive for dysuria, frequency and urgency. Negative for flank pain, hematuria and vaginal discharge.   Musculoskeletal:  Negative for arthralgias, back pain and myalgias.  Skin:  Negative for pallor and rash.  Allergic/Immunologic: Negative for environmental allergies.  Neurological:  Negative for dizziness, syncope and headaches.  Hematological:  Negative for adenopathy. Does not bruise/bleed easily.  Psychiatric/Behavioral:  Negative for decreased concentration and dysphoric mood. The patient is not nervous/anxious.       Objective:   Physical Exam Constitutional:      General: She is not in acute distress.    Appearance: Normal appearance. She is well-developed. She is not ill-appearing.  HENT:     Head: Normocephalic and atraumatic.  Eyes:     Conjunctiva/sclera: Conjunctivae normal.     Pupils: Pupils are equal, round, and reactive to light.  Cardiovascular:     Rate and Rhythm: Normal rate and regular rhythm.     Heart sounds: Normal heart sounds.  Pulmonary:     Effort: Pulmonary effort is normal.     Breath sounds: Normal breath sounds.  Abdominal:     General: Bowel sounds are normal. There is no distension.     Palpations: Abdomen is soft.     Tenderness: There is abdominal tenderness. There is no rebound.     Comments: No cva tenderness  Mild suprapubic tenderness without fullness  Musculoskeletal:     Cervical back: Normal range of motion and neck supple.  Lymphadenopathy:     Cervical: No cervical adenopathy.  Skin:    Findings: No erythema or rash.  Neurological:     Mental Status: She is alert.  Psychiatric:        Mood and Affect: Mood normal.          Assessment & Plan:   Problem List Items Addressed This Visit       Genitourinary   Acute cystitis - Primary    Much improvement after one dose of macrobid and also azo use  ua clear (due to above) Px 7 d of macrobid and enc water intake  Culture pending Update if not starting to improve in a week or if worsening   Handout on uti given       Relevant Orders   Urine Culture   Other Visit  Diagnoses     Urinary frequency       Relevant Orders   POCT urinalysis dipstick (Completed)

## 2021-04-06 ENCOUNTER — Ambulatory Visit: Payer: BC Managed Care – PPO | Admitting: Family Medicine

## 2021-04-06 LAB — URINE CULTURE
MICRO NUMBER:: 12016146
Result:: NO GROWTH
SPECIMEN QUALITY:: ADEQUATE

## 2021-04-09 ENCOUNTER — Telehealth: Payer: Self-pay | Admitting: *Deleted

## 2021-04-09 NOTE — Telephone Encounter (Signed)
PLEASE NOTE: All timestamps contained within this report are represented as [place] Standard Time. CONFIDENTIALTY NOTICE: This fax transmission is intended only for the addressee. It contains information that is legally privileged, confidential or otherwise protected from use or disclosure. If you are not the intended recipient, you are strictly prohibited from reviewing, disclosing, copying using or disseminating any of this information or taking any action in reliance on or regarding this information. If you have received this fax in error, please notify us immediately by telephone so that we can arrange for its return to [place]. Phone: [phone], Toll-Free: [phone], Fax: [phone] Page: 1 of 1 Call Id: [id number] [facility] Night - Client TELEPHONE ADVICE RECORD [facility] Patient Name: Norine HU RSEY Gender: Female DOB: Jan 21, 1966 Age: 55 Y 10 M 3 D Return Phone Number: [phone] (Primary) Address[place] [address] Client [facility] Night - Client Client Site [facility] [place] - Night Physician [place], Idamae Schuller - MD Contact Type Call Who Is Calling Patient / Member / Family / Caregiver Call Type Triage / Clinical Relationship To Patient Self Return Phone Number [phone] (Primary) Chief Complaint Urination Frequency Reason for Call Symptomatic / Request for Health Information Initial Comment [name] states she left her prescription at home and she is wondering if she can get a few called in. It was for a UTI Translation No Disp. Time Lamount Cohen Time) Disposition Final User 04/06/2021 9:08:03 PM Attempt made - message left Zayas, RN, Melissa 04/06/2021 9:35:51 PM FINAL ATTEMPT MADE - message left Pollyann Savoy, RN, Melissa 04/06/2021 9:36:10 PM Send to RN Final Attempt Griffin Dakin, RN, Melissa 04/06/2021 11:07:16 PM FINAL ATTEMPT MADE - no message left Yes Tomes, RN, Tresa Endo Comments User:  Candida Peeling, RN Date/Time Lamount Cohen Time): 04/06/2021 11:07:07 PM phone goes to VM

## 2021-04-09 NOTE — Telephone Encounter (Signed)
Left message on voicemail for patient to call the office back. 

## 2021-04-10 NOTE — Telephone Encounter (Signed)
Left another message on voicemail for patient to call the office back. Will wait for patient to return the call. Have left her two messages on her voicemail.

## 2021-04-13 NOTE — Telephone Encounter (Signed)
Have left patient two messages and she has not returned the call.

## 2021-04-30 ENCOUNTER — Telehealth: Payer: Self-pay

## 2021-04-30 NOTE — Telephone Encounter (Signed)
LVM

## 2021-04-30 NOTE — Telephone Encounter (Signed)
[facility] Night - Client TELEPHONE ADVICE RECORD [facility] Patient Name: Alice Page Gender: Female DOB: 02-May-1966 Age: 55 Y 10 M 24 D Return Phone Number: [phone] (Primary) Address: City/ State/ Zip: [place] [address] Client [facility] Night - Client Client Site [facility] [place] - Night Physician Tower, Idamae Schuller - MD Contact Type Call Who Is Calling Patient / Member / Family / Caregiver Call Type Triage / Clinical Relationship To Patient Self Return Phone Number [phone] (Primary) Chief Complaint Eye Pus Or Discharge Reason for Call Symptomatic / Request for Health Information Initial Comment [name] states she has been at the beach with her grandchildren. They had pink eye and now she has woken up with some discharge in her eye. She is wanting to know if she can get some eye drops called in. Translation No Nurse Assessment Nurse: Noe Gens, RN, Archie Patten Date/Time (Eastern Time): 04/28/2021 11:30:10 AM Confirm and document reason for call. If symptomatic, describe symptoms. ---[name] states she is having discharge in her eye that is yellow in color. Denies fever or any other issues. Does the patient have any new or worsening symptoms? ---Yes Will a triage be completed? ---Yes Related visit to physician within the last 2 weeks? ---No Does the PT have any chronic conditions? (i.e. diabetes, asthma, this includes High risk factors for pregnancy, etc.) ---No Is this a behavioral health or substance abuse call? ---No Nurse: Noe Gens, RN, Archie Patten Date/Time (Eastern Time): 04/28/2021 11:36:02 AM Please select the assessment type ---Standing order Additional Documentation ---Calling in RX for Conjunctivitis Other current medications? ---No Medication allergies? ---No Pharmacy name and phone number. ---[facility] [address] PH# [phone] PLEASE NOTE: All timestamps contained within this report are  represented as [place] Standard Time. CONFIDENTIALTY NOTICE: This fax transmission is intended only for the addressee. It contains information that is legally privileged, confidential or otherwise protected from use or disclosure. If you are not the intended recipient, you are strictly prohibited from reviewing, disclosing, copying using or disseminating any of this information or taking any action in reliance on or regarding this information. If you have received this fax in error, please notify us immediately by telephone so that we can arrange for its return to [place]. Phone: [phone], Toll-Free: [phone], Fax: [phone] Page: 2 of 3 Call Id: [id number] Guidelines Guideline Title Affirmed Question Affirmed Notes Nurse Date/Time Lamount Cohen Time) Eye - Pus or Discharge [1] Eye with yellow or green discharge, or eyelashes stick together AND [2] PCP standing order to call in antibiotic eye drops Noe Gens, RN, Archie Patten 04/28/2021 11:31:10 AM Disp. Time Lamount Cohen Time) Disposition Final User 04/28/2021 11:15:10 AM Attempt made - message left Tomasa Rand 04/28/2021 11:35:39 AM Home Care Yes Noe Gens, RN, Archie Patten [name] Disagree/Comply Comply [name] Understands Yes PreDisposition Did not know what to do Care Advice Given Per Guideline HOME CARE: * You should be able to treat this at home. REASSURANCE AND EDUCATION - PROBABLE BACTERIAL CONJUNCTIVITIS: * Conjunctivitis (pinkeye) caused by bacteria is a common complication of a cold. You can also get it from exposure to a child or adult who has had it recently. It is not harmful to your vision. EYELID CLEANSING: * Gently wash eyelids and lashes with warm water and wet cotton balls (or cotton gauze). Remove all the dried and liquid pus. * Do this as often as needed. NOTE TO TRIAGER - PRESCRIPTION OPTION FOR ANTIBIOTIC EYEDROPS PER PROTOCOL - [place]: * If PCP approves calling in prescription, do  so per protocol. * Prescription: Polytrim  (polymyxin-trimethoprim) eyedrops, 5 ml bottle ($12) * Additional Instructions: Continue using eyedrops until you have awakened two mornings without pus in the eyes. * Be certain to read the package instructions and warnings. CONTAGIOUSNESS: * Pinkeye is contagious. Try not to touch your eyes. Wash your hands frequently. Do not share towels. CALL BACK IF: * Pus lasts over 3 days (72 hours) on treatment * Blurred vision occurs * Light bothers your eyes * More than just mild eye discomfort * You become worse CARE ADVICE given per Eye - Pus or Discharge (Adult) guideline. Standing Orders Preparation Additional Instructions Route FrequencyDuration Nurse Comments User Name Polytrim Eye Drops 2 drops both eyes Eye Four Times Daily 5 Days Noe Gens, RN, Archie Patten PLEASE NOTE: All timestamps contained within this report are represented as [place] Standard Time. CONFIDENTIALTY NOTICE: This fax transmission is intended only for the addressee. It contains information that is legally privileged, confidential or otherwise protected from use or disclosure. If you are not the intended recipient, you are strictly prohibited from reviewing, disclosing, copying using or disseminating any of this information or taking any action in reliance on or regarding this information. If you have received this fax in error, please notify us immediately by telephone so that we can arrange for its return to [place]. Phone: [phone], Toll-Free: [phone], Fax: [phone] Page: 3 of 3 Call Id: [id number] [facility] [address], [address] [place], [place] [address] [phone] Fax: [phone] MEDICATION ORDER [facility] Night - Client [facility] [place] - Night Date: 04/28/2021 From: QI Department To: Tower, Idamae Schuller - MD This is an approved standing order given by our call center nurse on your behalf. Thank you. Date Lamount Cohen Time): 04/28/2021 10:55:50 AM Triage  RN: Juliette Alcide, RN NAME: Alice Page PHONE NUMBER: [phone] (Primary) BIRTHDATE: [birth year] [place] [id number] [name]: Self NAME: Rx Given Preparation Additional Instructions Route FrequencyDuration Nurse Comments User Name Polytrim Eye Drops 2 drops both eyes Eye Four Times Daily

## 2021-04-30 NOTE — Telephone Encounter (Signed)
Pt reports discharge from eye has completely cleared since using the drops. Advised if any symptoms return or she develops any new symptoms to contact the office. Pt verbalized understanding.

## 2021-05-18 DIAGNOSIS — N941 Unspecified dyspareunia: Secondary | ICD-10-CM | POA: Diagnosis not present

## 2021-05-18 DIAGNOSIS — N952 Postmenopausal atrophic vaginitis: Secondary | ICD-10-CM | POA: Diagnosis not present

## 2021-06-28 DIAGNOSIS — Z1231 Encounter for screening mammogram for malignant neoplasm of breast: Secondary | ICD-10-CM | POA: Diagnosis not present

## 2021-06-28 DIAGNOSIS — Z6821 Body mass index (BMI) 21.0-21.9, adult: Secondary | ICD-10-CM | POA: Diagnosis not present

## 2021-06-28 DIAGNOSIS — Z01419 Encounter for gynecological examination (general) (routine) without abnormal findings: Secondary | ICD-10-CM | POA: Diagnosis not present

## 2021-06-28 DIAGNOSIS — Z1389 Encounter for screening for other disorder: Secondary | ICD-10-CM | POA: Diagnosis not present

## 2021-09-03 DIAGNOSIS — D2261 Melanocytic nevi of right upper limb, including shoulder: Secondary | ICD-10-CM | POA: Diagnosis not present

## 2021-09-03 DIAGNOSIS — D2262 Melanocytic nevi of left upper limb, including shoulder: Secondary | ICD-10-CM | POA: Diagnosis not present

## 2021-09-03 DIAGNOSIS — D2271 Melanocytic nevi of right lower limb, including hip: Secondary | ICD-10-CM | POA: Diagnosis not present

## 2021-09-03 DIAGNOSIS — L309 Dermatitis, unspecified: Secondary | ICD-10-CM | POA: Diagnosis not present

## 2021-09-10 ENCOUNTER — Other Ambulatory Visit: Payer: Self-pay | Admitting: Family Medicine

## 2021-09-11 NOTE — Telephone Encounter (Signed)
Last OV was a ? UTI on 04/05/21, last filled on 01/24/21 #30 g with 0 refills

## 2021-09-18 DIAGNOSIS — H35373 Puckering of macula, bilateral: Secondary | ICD-10-CM | POA: Diagnosis not present

## 2021-09-18 DIAGNOSIS — H40023 Open angle with borderline findings, high risk, bilateral: Secondary | ICD-10-CM | POA: Diagnosis not present

## 2021-11-27 DIAGNOSIS — D225 Melanocytic nevi of trunk: Secondary | ICD-10-CM | POA: Diagnosis not present

## 2021-11-27 DIAGNOSIS — L821 Other seborrheic keratosis: Secondary | ICD-10-CM | POA: Diagnosis not present

## 2021-11-27 DIAGNOSIS — L820 Inflamed seborrheic keratosis: Secondary | ICD-10-CM | POA: Diagnosis not present

## 2021-11-27 DIAGNOSIS — D2239 Melanocytic nevi of other parts of face: Secondary | ICD-10-CM | POA: Diagnosis not present

## 2022-01-02 DIAGNOSIS — M25551 Pain in right hip: Secondary | ICD-10-CM | POA: Diagnosis not present

## 2022-01-02 DIAGNOSIS — M5451 Vertebrogenic low back pain: Secondary | ICD-10-CM | POA: Diagnosis not present

## 2022-01-07 DIAGNOSIS — M5451 Vertebrogenic low back pain: Secondary | ICD-10-CM | POA: Diagnosis not present

## 2022-01-07 DIAGNOSIS — M25551 Pain in right hip: Secondary | ICD-10-CM | POA: Diagnosis not present

## 2022-01-09 DIAGNOSIS — M5451 Vertebrogenic low back pain: Secondary | ICD-10-CM | POA: Diagnosis not present

## 2022-01-09 DIAGNOSIS — M25551 Pain in right hip: Secondary | ICD-10-CM | POA: Diagnosis not present

## 2022-01-14 DIAGNOSIS — M5451 Vertebrogenic low back pain: Secondary | ICD-10-CM | POA: Diagnosis not present

## 2022-01-14 DIAGNOSIS — M25551 Pain in right hip: Secondary | ICD-10-CM | POA: Diagnosis not present

## 2022-01-16 ENCOUNTER — Other Ambulatory Visit: Payer: Self-pay

## 2022-01-16 ENCOUNTER — Encounter: Payer: Self-pay | Admitting: Family Medicine

## 2022-01-16 ENCOUNTER — Ambulatory Visit: Payer: BC Managed Care – PPO | Admitting: Family Medicine

## 2022-01-16 VITALS — BP 128/86 | HR 62 | Temp 97.3°F | Ht 67.25 in | Wt 144.4 lb

## 2022-01-16 DIAGNOSIS — H6123 Impacted cerumen, bilateral: Secondary | ICD-10-CM

## 2022-01-16 DIAGNOSIS — L918 Other hypertrophic disorders of the skin: Secondary | ICD-10-CM | POA: Diagnosis not present

## 2022-01-16 DIAGNOSIS — R2242 Localized swelling, mass and lump, left lower limb: Secondary | ICD-10-CM | POA: Diagnosis not present

## 2022-01-16 DIAGNOSIS — R2240 Localized swelling, mass and lump, unspecified lower limb: Secondary | ICD-10-CM | POA: Insufficient documentation

## 2022-01-16 DIAGNOSIS — H6120 Impacted cerumen, unspecified ear: Secondary | ICD-10-CM | POA: Insufficient documentation

## 2022-01-16 DIAGNOSIS — M7989 Other specified soft tissue disorders: Secondary | ICD-10-CM | POA: Diagnosis not present

## 2022-01-16 NOTE — Assessment & Plan Note (Addendum)
Affecting hearing, bilaterally  ? ?After consent obtained, simple ear irrigation performed achieving resolution and restoring hearing  ?Much improvement ?Disc ways to prevent in future, avoid Qtips in ear , debrox prn  ?

## 2022-01-16 NOTE — Progress Notes (Signed)
? ?Subjective:  ? ? Patient ID: Alice Page, female    DOB: Sep 09, 1966, 56 y.o.   MRN: [id number] ? ?This visit occurred during the SARS-CoV-2 public health emergency.  Safety protocols were in place, including screening questions prior to the visit, additional usage of staff PPE, and extensive cleaning of exam room while observing appropriate contact time as indicated for disinfecting solutions.  ? ?HPI ?Pt presents with ear pain/muffled hearing  ?R 3rd finger is swollen ?Lump on bottom of L foot  ?Something in throat  ? ?Wt Readings from Last 3 Encounters:  ?01/16/22 144 lb 6 oz (65.5 kg)  ?04/05/21 139 lb (63 kg)  ?01/23/21 138 lb 1 oz (62.6 kg)  ? ?22.44 kg/m? ? ?Intermittent pain  ?More in R than L ear  ?For a while  ?Friday am her hearing was muffled in R ear- then improved ?On and off fri/sat  ?Feels like she just cannot hear great  ? ?In the past had wax in ears  ? ?Also allergy symptoms this season  ?Occ gets pressure  ?Not a lot of nasal symptoms yet  ?Has lingering cough for years - phlegm in am then is good for the rest of the day  ? ?R 3rd finger is swollen - approx 3-4 months  ?Nail grows out funky ? ?Knot on bottom of L foot- a while , 1 year or more  ?Tender to the touch but no problems walking  ?Cannot see anything there  ? ?Skin tag on the back of her throat /flap of skin   -top of mouth - at least a year  ?She can feel it  ?Does not hurt  ?Felt like a kernel is there  ?No h/o trauma  ? ?Patient Active Problem List  ? Diagnosis Date Noted  ? Cerumen impaction 01/16/2022  ? Mass of foot 01/16/2022  ? Skin tag 01/16/2022  ? Finger swelling 01/16/2022  ? Groin swelling 01/23/2021  ? Lumbar back pain with radiculopathy affecting right lower extremity 04/07/2020  ? Disc disorder of lumbar region 04/07/2020  ? Chronic cough 04/21/2019  ? Tail bone pain 04/21/2019  ? Elevated glucose level 01/12/2018  ? Immunization reaction 01/26/2016  ? Colon cancer screening 12/19/2015  ? Routine general medical  examination at a health care facility 12/07/2015  ? Joint pain 05/16/2014  ? Migraine without aura, not refractory 07/31/2012  ? SYNCOPE 10/05/2007  ? INCREASED INTRAOCULAR PRESSURE 07/21/2007  ? Allergic rhinitis 07/21/2007  ? ?Past Medical History:  ?Diagnosis Date  ? Anemia   ? Heavy menses   ? History of allergic rhinitis   ? History of recurrent UTIs   ? ?Past Surgical History:  ?Procedure Laterality Date  ? LUMBAR LAMINECTOMY    ? Dr Lovell Sheehan  ? ?Social History  ? ?Tobacco Use  ? Smoking status: Never  ? Smokeless tobacco: Never  ?Substance Use Topics  ? Alcohol use: No  ?  Alcohol/week: 0.0 standard drinks  ? Drug use: No  ? ?History reviewed. No pertinent family history. ?Allergies  ?Allergen Reactions  ? Cetirizine Hcl   ?  REACTION: sedation  ? Ciprofloxacin   ?  REACTION: joint pain  ? Tdap [Tetanus-Diphth-Acell Pertussis] Other (See Comments)  ?  Arm pain  ? ?Current Outpatient Medications on File Prior to Visit  ?Medication Sig Dispense Refill  ? Aspirin-Acetaminophen-Caffeine (EXCEDRIN PO) Take by mouth as needed.    ? fluticasone (FLONASE) 50 MCG/ACT nasal spray Place into both nostrils daily as needed for  allergies or rhinitis.    ? loratadine (CLARITIN) 10 MG tablet Take 10 mg by mouth daily.    ? ?No current facility-administered medications on file prior to visit.  ?  ?Review of Systems  ?Constitutional:  Negative for activity change, appetite change, fatigue, fever and unexpected weight change.  ?HENT:  Positive for ear pain and hearing loss. Negative for congestion, ear discharge, rhinorrhea, sinus pressure and sore throat.   ?Eyes:  Negative for pain, redness and visual disturbance.  ?Respiratory:  Negative for cough, shortness of breath and wheezing.   ?Cardiovascular:  Negative for chest pain and palpitations.  ?Gastrointestinal:  Negative for abdominal pain, blood in stool, constipation and diarrhea.  ?Endocrine: Negative for polydipsia and polyuria.  ?Genitourinary:  Negative for dysuria,  frequency and urgency.  ?Musculoskeletal:  Negative for arthralgias, back pain and myalgias.  ?Skin:  Positive for color change. Negative for pallor and rash.  ?Allergic/Immunologic: Negative for environmental allergies.  ?Neurological:  Negative for dizziness, syncope and headaches.  ?Hematological:  Negative for adenopathy. Does not bruise/bleed easily.  ?Psychiatric/Behavioral:  Negative for decreased concentration and dysphoric mood. The patient is not nervous/anxious.   ? ?   ?Objective:  ? Physical Exam ?Constitutional:   ?   General: She is not in acute distress. ?   Appearance: Normal appearance. She is normal weight. She is not ill-appearing or diaphoretic.  ?HENT:  ?   Head: Normocephalic and atraumatic.  ?   Right Ear: There is impacted cerumen.  ?   Left Ear: There is impacted cerumen.  ?   Ears:  ?   Comments: Bilateral wet cerumen impaction  ?Cleared with simple irritation after consent obtained  ?Pt tolerated well  ? ?TMs and canals were then clear with improvement in hearing  ?   Nose:  ?   Comments: Boggy nares ?   Mouth/Throat:  ?   Mouth: Mucous membranes are moist.  ?   Pharynx: Oropharynx is clear. No oropharyngeal exudate or posterior oropharyngeal erythema.  ?   Comments: Small 2-3 mm pedunculated tag noted in L posterior palate/throat ?Eyes:  ?   General:     ?   Right eye: No discharge.     ?   Left eye: No discharge.  ?   Conjunctiva/sclera: Conjunctivae normal.  ?   Pupils: Pupils are equal, round, and reactive to light.  ?Neck:  ?   Vascular: No carotid bruit.  ?Cardiovascular:  ?   Rate and Rhythm: Normal rate and regular rhythm.  ?   Heart sounds: Normal heart sounds.  ?Pulmonary:  ?   Effort: Pulmonary effort is normal. No respiratory distress.  ?   Breath sounds: Normal breath sounds. No wheezing.  ?Musculoskeletal:  ?   Cervical back: Normal range of motion and neck supple.  ?   Comments: Small soft lump noted in arch of L foot, is mobile and not tender   ?Lymphadenopathy:  ?    Cervical: No cervical adenopathy.  ?Skin: ?   Findings: No bruising or rash.  ?   Comments: Proximal nail fold area of R 3rd finger appears mildly swollen and pink  ?No tenderness/lump or drainage  ?Nail is ridged (horizontal) proximally  ?  ?Neurological:  ?   Mental Status: She is alert.  ?   Cranial Nerves: No cranial nerve deficit.  ?   Sensory: No sensory deficit.  ?   Coordination: Coordination normal.  ?Psychiatric:     ?   Mood and  Affect: Mood normal.  ? ? ? ? ? ?   ?Assessment & Plan:  ? ?Problem List Items Addressed This Visit   ? ?  ? Nervous and Auditory  ? Cerumen impaction - Primary  ?  Affecting hearing, bilaterally  ? ?After consent obtained, simple ear irrigation performed achieving resolution and restoring hearing  ?Much improvement ?Disc ways to prevent in future, avoid Qtips in ear , debrox prn  ?  ?  ?  ? Musculoskeletal and Integument  ? Skin tag (Chronic)  ?  L side of soft palate near throat (pt curious but not bothered by it) ?2-3 mm maximum ?Pedunculated  ?Not bothersome  ?Plan to observe  ?  ?  ?  ? Other  ? Finger swelling  ?  Right 3rd finger- at cuticle edge/resembles mild chronic  paronychia    ?No joint changes  ?The nail is ridged proximally  ?Unsure if trauma prior  ? ?Recommend she keep finger clean and dry with otc cortisone cream, then derm eval ?  ?  ? Mass of foot  ?  Small mobile lump (soft) felt in arch of L foot  ?Mildly tender if any and does not interfere with walking  ? ?Ganglion, neuroma, lipoma and plantar fibromatosis  are in the differential  ?Recommend eval by podiatry  ?Pt is not particularly bothered and may opt for this later  ?Recommend offloading this spot with shoe wear if needed  ?  ?  ? ? ?

## 2022-01-16 NOTE — Assessment & Plan Note (Addendum)
Right 3rd finger- at cuticle edge/resembles mild chronic  paronychia    ?No joint changes  ?The nail is ridged proximally  ?Unsure if trauma prior  ? ?Recommend she keep finger clean and dry with otc cortisone cream, then derm eval ?

## 2022-01-16 NOTE — Assessment & Plan Note (Signed)
L side of soft palate near throat (pt curious but not bothered by it) ?2-3 mm maximum ?Pedunculated  ?Not bothersome  ?Plan to observe  ?

## 2022-01-16 NOTE — Assessment & Plan Note (Addendum)
Small mobile lump (soft) felt in arch of L foot  ?Mildly tender if any and does not interfere with walking  ? ?Ganglion, neuroma, lipoma and plantar fibromatosis  are in the differential  ?Recommend eval by podiatry  ?Pt is not particularly bothered and may opt for this later  ?Recommend offloading this spot with shoe wear if needed  ?

## 2022-01-16 NOTE — Patient Instructions (Addendum)
Keep your finger clean and dry  ?You can use a topical steroid cream like cort aid  ?See your dermatologist to take a look  ? ?Debrox ear drops over the counter are helpful for ear wax ? ?If the lump on your foot gets bigger or bothersome we will set you up with a podiatrist  ? ? ? ? ?

## 2022-01-17 DIAGNOSIS — M5451 Vertebrogenic low back pain: Secondary | ICD-10-CM | POA: Diagnosis not present

## 2022-01-17 DIAGNOSIS — M25551 Pain in right hip: Secondary | ICD-10-CM | POA: Diagnosis not present

## 2022-01-24 DIAGNOSIS — M25551 Pain in right hip: Secondary | ICD-10-CM | POA: Diagnosis not present

## 2022-01-24 DIAGNOSIS — M5451 Vertebrogenic low back pain: Secondary | ICD-10-CM | POA: Diagnosis not present

## 2022-01-28 DIAGNOSIS — M5451 Vertebrogenic low back pain: Secondary | ICD-10-CM | POA: Diagnosis not present

## 2022-01-28 DIAGNOSIS — M25551 Pain in right hip: Secondary | ICD-10-CM | POA: Diagnosis not present

## 2022-01-30 DIAGNOSIS — M5451 Vertebrogenic low back pain: Secondary | ICD-10-CM | POA: Diagnosis not present

## 2022-01-30 DIAGNOSIS — M25551 Pain in right hip: Secondary | ICD-10-CM | POA: Diagnosis not present

## 2022-02-22 DIAGNOSIS — M4726 Other spondylosis with radiculopathy, lumbar region: Secondary | ICD-10-CM | POA: Diagnosis not present

## 2022-02-28 ENCOUNTER — Ambulatory Visit (INDEPENDENT_AMBULATORY_CARE_PROVIDER_SITE_OTHER)
Admission: RE | Admit: 2022-02-28 | Discharge: 2022-02-28 | Disposition: A | Discharge status: Home / Self Care | Payer: BC Managed Care – PPO | Source: Ambulatory Visit | Attending: Internal Medicine | Admitting: Internal Medicine

## 2022-02-28 ENCOUNTER — Ambulatory Visit: Payer: BC Managed Care – PPO | Admitting: Internal Medicine

## 2022-02-28 ENCOUNTER — Encounter: Payer: Self-pay | Admitting: Internal Medicine

## 2022-02-28 ENCOUNTER — Telehealth: Payer: Self-pay | Admitting: Family Medicine

## 2022-02-28 VITALS — BP 122/80 | HR 69 | Temp 97.6°F | Ht 68.0 in | Wt 139.0 lb

## 2022-02-28 DIAGNOSIS — R0789 Other chest pain: Secondary | ICD-10-CM | POA: Diagnosis not present

## 2022-02-28 DIAGNOSIS — R079 Chest pain, unspecified: Secondary | ICD-10-CM | POA: Diagnosis not present

## 2022-02-28 NOTE — Progress Notes (Signed)
? ?Subjective:  ? ? Patient ID: Alice Page, female    DOB: 09-20-1966, 56 y.o.   MRN: [id number] ? ?HPI ?Here due to chest pain ? ?Less than a week ago--she had a real sharp pain in her left chest in bed ?Finally got better after about 20 minutes ?Then was fine ? ?Then today at work about 11:30AM--got substernal chest discomfort--and became "superintense" ?Felt heavy, tight and "really painful" ?Sat down --it persisted for ~15 minutes ?Took an aspirin--chewed ?Pain gradually went away ?After--she noted pain between her shoulder blades for about an hour ?No SOB, nausea or diaphoresis ? ?No significant exercise---does walk some ?Will stroll 1-2 miles (like talking on phone) ?No symptoms with that ? ?Recently restarted PT for back problems ? ?Had illness about a month ago ?Headache, aching, etc ?Some cough and head congestion ?Better over 2 weeks ? ?Current Outpatient Medications on File Prior to Visit  ?Medication Sig Dispense Refill  ? Aspirin-Acetaminophen-Caffeine (EXCEDRIN PO) Take by mouth as needed.    ? fluticasone (FLONASE) 50 MCG/ACT nasal spray Place into both nostrils daily as needed for allergies or rhinitis. (Patient not taking: Reported on 02/28/2022)    ? loratadine (CLARITIN) 10 MG tablet Take 10 mg by mouth daily. (Patient not taking: Reported on 02/28/2022)    ? ?No current facility-administered medications on file prior to visit.  ? ? ?Allergies  ?Allergen Reactions  ? Cetirizine Hcl   ?  REACTION: sedation  ? Ciprofloxacin   ?  REACTION: joint pain  ? Tdap [Tetanus-Diphth-Acell Pertussis] Other (See Comments)  ?  Arm pain  ? ? ?Past Medical History:  ?Diagnosis Date  ? Anemia   ? Heavy menses   ? History of allergic rhinitis   ? History of recurrent UTIs   ? ? ?Past Surgical History:  ?Procedure Laterality Date  ? LUMBAR LAMINECTOMY    ? Dr Lovell Sheehan  ? ? ?History reviewed. No pertinent family history. ? ?Social History  ? ?Socioeconomic History  ? Marital status: Married  ?  Spouse name: Not on  file  ? Number of children: Not on file  ? Years of education: Not on file  ? Highest education level: Not on file  ?Occupational History  ? Not on file  ?Tobacco Use  ? Smoking status: Never  ? Smokeless tobacco: Never  ?Substance and Sexual Activity  ? Alcohol use: No  ?  Alcohol/week: 0.0 standard drinks  ? Drug use: No  ? Sexual activity: Not on file  ?Other Topics Concern  ? Not on file  ?Social History Narrative  ? Not on file  ? ?Social Determinants of Health  ? ?Financial Resource Strain: Not on file  ?Food Insecurity: Not on file  ?Transportation Needs: Not on file  ?Physical Activity: Not on file  ?Stress: Not on file  ?Social Connections: Not on file  ?Intimate Partner Violence: Not on file  ? ?Review of Systems ?No breakfast--just coffee (typical) ?No heartburn or dysphagia ?Does get sense of something in her windpipe--occasionally can get something up ?Has a twitch feeling between lower left ribs in last few weeks ?FH negative for CAD ?   ?Objective:  ? Physical Exam ?Constitutional:   ?   Appearance: Normal appearance.  ?Cardiovascular:  ?   Rate and Rhythm: Normal rate and regular rhythm.  ?   Heart sounds: No murmur heard. ?  No friction rub. No gallop.  ?Pulmonary:  ?   Effort: Pulmonary effort is normal.  ?  Breath sounds: Normal breath sounds. No wheezing or rales.  ?Abdominal:  ?   Palpations: Abdomen is soft.  ?   Tenderness: There is no abdominal tenderness.  ?Musculoskeletal:  ?   Cervical back: Neck supple.  ?   Right lower leg: No edema.  ?   Left lower leg: No edema.  ?Lymphadenopathy:  ?   Cervical: No cervical adenopathy.  ?Neurological:  ?   Mental Status: She is alert.  ?  ? ? ? ? ?   ?Assessment & Plan:  ? ?

## 2022-02-28 NOTE — Telephone Encounter (Addendum)
I spoke with pt; pt said 11:30 am 02/28/22  pt was talking with someone at work and had dull and heavy feeling mid chest pain that lasted about 10 - 15 mins. Pt said never had pain like this before. Pt said as pain was easing off pain seemed to move in between shoulder blades for very short time before pain stopped completely. No SOB,H/A or dizziness. Pt was not upset or excited or exerting herself. No weakness in arms or legs. Pt cannot take BP now. Pt on no regular meds. Pt has not eaten so far today.Pt said middle of last wk when she was laying down pt had a sharpe lt sided pain in lt breast that lasted for about 20 mins. But that pain was totally different from pain she had earlier today. No pain now. Pt does not want to go to ED and pt scheduled appt with Dr Silvio Pate 02/28/22 at 2:45. UC & ED precautions given and pt voiced understanding.Dr Silvio Pate and Larene Beach CMA are aware. ? ? ? ?[facility] - Client ?TELEPHONE ADVICE RECORD ?AccessNurse? ?Patient ?Name: ?Alice Page ?RSEY ?Gender: Female ?DOB: April 22, 1966 ?Age: 56 Y 84 M 26 D ?Return ?Phone ?Number: ?[id number] ?(Primary) ?Address: ?City/ ?State/ ?Zip: [place] ?[address] ?Client [facility] ?Client Site [facility] ?Provider [place], Roque Lias - MD ?Contact Type Call ?Who Is Calling Patient / Member / Family / Caregiver ?Call Type Triage / Clinical ?Relationship To Patient Self ?Return Phone Number [phone] (Primary) ?Chief Complaint CHEST PAIN - pain, pressure, heaviness or ?tightness ?Reason for Call Symptomatic / Request for Health Information ?Initial Comment [name] states that they are complaining of ?intermittent chest pain. ?Translation No ?Nurse Assessment ?Nurse: Toribio Harbour RN, Joelene Millin Date/Time (Eastern Time): 02/28/2022 12:18:59 PM ?Confirm and document reason for call. If ?symptomatic, describe symptoms. ?---[name] states that they are complaining of ?intermittent chest  pain. It started several nights ago but ?went away. ?Does the patient have any new or worsening ?symptoms? ---Yes ?Will a triage be completed? ---Yes ?Related visit to physician within the last 2 weeks? ---No ?Does the PT have any chronic conditions? (i.e. ?diabetes, asthma, this includes High risk factors for ?pregnancy, etc.) ?---No ?Is this a behavioral health or substance abuse call? ---No ?Guidelines ?Guideline Title Affirmed Question Affirmed Notes Nurse Date/Time (Eastern ?Time) ?Chest Pain [1] Chest pain lasts > ?5 minutes AND [2] ?age > 50 ?Toribio Harbour, RN, ?Joelene Millin ?02/28/2022 12:20:46 ?PM ?Disp. Time (Eastern ?Time) Disposition Final User ?02/28/2022 12:16:12 PM Send to Urgent Roxanne Mins, [place] ?02/28/2022 12:27:16 PM 911 Outcome Documentation Daves, RN, Joelene Millin ?Reason: [name] refused 911. States ?wants to be seen in the office. Warm ?transferred to office. ?PLEASE NOTE: All timestamps contained within this report are represented as [place] Standard Time. ?CONFIDENTIALTY NOTICE: This fax transmission is intended only for the addressee. It contains information that is legally privileged, confidential or ?otherwise protected from use or disclosure. If you are not the intended recipient, you are strictly prohibited from reviewing, disclosing, copying using ?or disseminating any of this information or taking any action in reliance on or regarding this information. If you have received this fax in error, please ?notify us immediately by telephone so that we can arrange for its return to [place]. Phone: [phone], Toll-Free: [phone], Fax: [phone] ?Page: 2 of 2 ?Call Id: [id number] ?02/28/2022 12:24:44 PM Call EMS 911 Now Yes Toribio Harbour, RN, Joelene Millin ?[name] Disagree/Comply Disagree ?[name] Understands Yes ?PreDisposition Call Doctor ?Care  Advice Given Per Guideline ?CALL EMS 911 NOW: * Immediate medical attention is needed. You need to hang up and call 911 (or an ambulance). * Triager ?Discretion: I'll call you  back in a few minutes to be sure you were able to reach them. CARE ADVICE given per Chest Pain (Adult) ?guideline. NOTE TO TRIAGER - IF [name] ASKS ABOUT ASPIRIN: * Call EMS 911 first. ?Referrals ?GO TO FACILITY REFUSE ?

## 2022-02-28 NOTE — Telephone Encounter (Signed)
Will assess her at [place] ?

## 2022-02-28 NOTE — Telephone Encounter (Signed)
Patient called stating she was having chest pain, said she had strong chest pain about 30 minutes prior to the call ? ?Patient transferred to access nurse ? ?Outcome was to call 911, patient refused, prefers an appointment ? ?Spoke to [place], advised her that someone will call her at [phone] ? ?Advised access nurse to send over notes promptly ?

## 2022-02-28 NOTE — Assessment & Plan Note (Signed)
Had a spell in bed that was different ---a few weeks ago ?Today, tightness and severe pain for about 20 minutes ?No associated symptoms ?Feels fine now ?Doesn't exercise heavily---but no recent changes in exercise tolerance ? ?EKG shows sinus at 71, possible incomplete RBBB and right atrial enlargment. No hypertrophy or ischemic changes. No change from 10/13/14 ? ?Will check CXR to be sure no obvious thoracic reason for the pain---this looks completely normal ? ?Will go ahead with referral to cardiology---will likely check a cardiac CT score ?

## 2022-03-05 DIAGNOSIS — M4726 Other spondylosis with radiculopathy, lumbar region: Secondary | ICD-10-CM | POA: Diagnosis not present

## 2022-03-12 DIAGNOSIS — M4726 Other spondylosis with radiculopathy, lumbar region: Secondary | ICD-10-CM | POA: Diagnosis not present

## 2022-03-19 DIAGNOSIS — M4726 Other spondylosis with radiculopathy, lumbar region: Secondary | ICD-10-CM | POA: Diagnosis not present

## 2022-03-22 DIAGNOSIS — M4726 Other spondylosis with radiculopathy, lumbar region: Secondary | ICD-10-CM | POA: Diagnosis not present

## 2022-03-26 DIAGNOSIS — M4726 Other spondylosis with radiculopathy, lumbar region: Secondary | ICD-10-CM | POA: Diagnosis not present

## 2022-03-28 DIAGNOSIS — M4726 Other spondylosis with radiculopathy, lumbar region: Secondary | ICD-10-CM | POA: Diagnosis not present

## 2022-04-02 DIAGNOSIS — M4726 Other spondylosis with radiculopathy, lumbar region: Secondary | ICD-10-CM | POA: Diagnosis not present

## 2022-04-04 DIAGNOSIS — M4726 Other spondylosis with radiculopathy, lumbar region: Secondary | ICD-10-CM | POA: Diagnosis not present

## 2022-04-15 ENCOUNTER — Ambulatory Visit: Payer: BC Managed Care – PPO | Admitting: Cardiology

## 2022-04-15 DIAGNOSIS — M4726 Other spondylosis with radiculopathy, lumbar region: Secondary | ICD-10-CM | POA: Diagnosis not present

## 2022-04-17 DIAGNOSIS — M4726 Other spondylosis with radiculopathy, lumbar region: Secondary | ICD-10-CM | POA: Diagnosis not present

## 2022-04-30 DIAGNOSIS — M4726 Other spondylosis with radiculopathy, lumbar region: Secondary | ICD-10-CM | POA: Diagnosis not present

## 2022-07-02 DIAGNOSIS — D3709 Neoplasm of uncertain behavior of other specified sites of the oral cavity: Secondary | ICD-10-CM | POA: Diagnosis not present

## 2022-07-11 DIAGNOSIS — Z01419 Encounter for gynecological examination (general) (routine) without abnormal findings: Secondary | ICD-10-CM | POA: Diagnosis not present

## 2022-07-11 DIAGNOSIS — Z1389 Encounter for screening for other disorder: Secondary | ICD-10-CM | POA: Diagnosis not present

## 2022-07-11 DIAGNOSIS — Z1231 Encounter for screening mammogram for malignant neoplasm of breast: Secondary | ICD-10-CM | POA: Diagnosis not present

## 2022-07-11 DIAGNOSIS — Z1211 Encounter for screening for malignant neoplasm of colon: Secondary | ICD-10-CM | POA: Diagnosis not present

## 2022-07-30 DIAGNOSIS — H04122 Dry eye syndrome of left lacrimal gland: Secondary | ICD-10-CM | POA: Diagnosis not present

## 2022-07-30 DIAGNOSIS — H5712 Ocular pain, left eye: Secondary | ICD-10-CM | POA: Diagnosis not present

## 2022-08-13 ENCOUNTER — Encounter: Payer: Self-pay | Admitting: Family Medicine

## 2022-08-13 ENCOUNTER — Ambulatory Visit: Payer: BC Managed Care – PPO | Admitting: Family Medicine

## 2022-08-13 DIAGNOSIS — R519 Headache, unspecified: Secondary | ICD-10-CM | POA: Diagnosis not present

## 2022-08-13 MED ORDER — PREDNISONE 10 MG PO TABS: ORAL_TABLET | ORAL | 0 refills | Status: DC

## 2022-08-13 MED ORDER — CYCLOBENZAPRINE HCL 10 MG PO TABS: 10.0000 mg | ORAL_TABLET | Freq: Every evening | ORAL | 0 refills | Status: DC | PRN

## 2022-08-13 NOTE — Patient Instructions (Addendum)
Find the pillow that fits  Memory foam works well  Try ice pack on the back of head again 10-20 minutes Take prednisone as directed  It will make you hyper and hungry   The flexeril at bedtime is fine for now   If not helpful let us know

## 2022-08-13 NOTE — Progress Notes (Signed)
Subjective:    Patient ID: Alice Page, female    DOB: Jul 17, 1966, 56 y.o.   MRN: [id number]  HPI Pt presents for c/o neck pain   Wt Readings from Last 3 Encounters:  08/13/22 139 lb 4 oz (63.2 kg)  02/28/22 139 lb (63 kg)  01/16/22 144 lb 6 oz (65.5 kg)   21.65 kg/m  Pt presents with neck pain for 3-4 weeks  R side  Has had it before  It turns into a headache  Dull pain -constant at occiput  Not throbbing  No n/t or weakness  Pain does not radiate to arm or hand   Not triggered by movement    Tried:  Advil : tried 2 pills up to tid  Excedrin  Goody powder New pillow New bed  Heat  Cold   Massage helps in the moment   H/o bad migraines in the past   Had flexeril from old injury  Tried taking at night  Helps a bit- wakes up feeling a bit better   Last CS film 2015 DG Cervical Spine Complete (Accession [id number]) (Order UL:[id number]) Imaging Date: 09/27/2014 Department: [facility] Released By: Ellamae Sia Authorizing: Braedyn Riggle, Wynelle Fanny, MD   Exam Status  Status  Final [99]   PACS Intelerad Image Link   Show images for DG Cervical Spine Complete  Study Result  Narrative & Impression  CLINICAL DATA:  Right neck pain   EXAM: CERVICAL SPINE  4+ VIEWS   COMPARISON:  None.   FINDINGS: Normal cervical lordosis.   No evidence of fracture or dislocation.   Vertebral body heights and intervertebral disc spaces are maintained. Dens appears intact. Lateral masses of C1 are symmetric.   Visualized lung apices are clear.   IMPRESSION: Negative cervical spine radiographs.     Woke up with puffy eyes on /off last week  That got better  Worse in am and get better   Stress is high  Has lost 3 friends to cancer   Patient Active Problem List   Diagnosis Date Noted   Pain of occiput 08/13/2022   Chest pain 02/28/2022   Cerumen impaction 01/16/2022   Mass of foot 01/16/2022   Skin tag 01/16/2022   Finger swelling  01/16/2022   Groin swelling 01/23/2021   Lumbar back pain with radiculopathy affecting right lower extremity 04/07/2020   Disc disorder of lumbar region 04/07/2020   Chronic cough 04/21/2019   Tail bone pain 04/21/2019   Elevated glucose level 01/12/2018   Immunization reaction 01/26/2016   Colon cancer screening 12/19/2015   Routine general medical examination at a health care facility 12/07/2015   Joint pain 05/16/2014   Migraine without aura, not refractory 07/31/2012   SYNCOPE 10/05/2007   INCREASED INTRAOCULAR PRESSURE 07/21/2007   Allergic rhinitis 07/21/2007   Past Medical History:  Diagnosis Date   Anemia    Heavy menses    History of allergic rhinitis    History of recurrent UTIs    Past Surgical History:  Procedure Laterality Date   LUMBAR LAMINECTOMY     Dr Arnoldo Morale   Social History   Tobacco Use   Smoking status: Never   Smokeless tobacco: Never  Substance Use Topics   Alcohol use: No    Alcohol/week: 0.0 standard drinks of alcohol   Drug use: No   History reviewed. No pertinent family history. Allergies  Allergen Reactions   Cetirizine Hcl     REACTION: sedation   Ciprofloxacin  REACTION: joint pain   Tdap [Tetanus-Diphth-Acell Pertussis] Other (See Comments)    Arm pain   Current Outpatient Medications on File Prior to Visit  Medication Sig Dispense Refill   Aspirin-Acetaminophen-Caffeine (EXCEDRIN PO) Take by mouth as needed.     fexofenadine (ALLEGRA) 60 MG tablet Take 60 mg by mouth daily as needed for allergies or rhinitis.     No current facility-administered medications on file prior to visit.    Review of Systems  Constitutional:  Positive for fatigue. Negative for activity change, appetite change, fever and unexpected weight change.  HENT:  Negative for congestion, ear pain, rhinorrhea, sinus pressure and sore throat.   Eyes:  Negative for pain, redness and visual disturbance.  Respiratory:  Negative for cough, shortness of breath and  wheezing.   Cardiovascular:  Negative for chest pain and palpitations.  Gastrointestinal:  Negative for abdominal pain, blood in stool, constipation and diarrhea.  Endocrine: Negative for polydipsia and polyuria.  Genitourinary:  Negative for dysuria, frequency and urgency.       Stress incontinence   Musculoskeletal:  Positive for neck pain. Negative for arthralgias, back pain, myalgias and neck stiffness.  Skin:  Negative for pallor and rash.  Allergic/Immunologic: Negative for environmental allergies.  Neurological:  Positive for headaches. Negative for dizziness and syncope.  Hematological:  Negative for adenopathy. Does not bruise/bleed easily.  Psychiatric/Behavioral:  Negative for decreased concentration and dysphoric mood. The patient is not nervous/anxious.        Objective:   Physical Exam Constitutional:      General: She is not in acute distress.    Appearance: Normal appearance. She is well-developed and normal weight. She is not ill-appearing or diaphoretic.  HENT:     Head: Normocephalic and atraumatic.     Comments: No facial tenderness    Right Ear: Tympanic membrane, ear canal and external ear normal. There is no impacted cerumen.     Left Ear: Tympanic membrane, ear canal and external ear normal. There is no impacted cerumen.     Nose: Nose normal. No congestion or rhinorrhea.     Mouth/Throat:     Mouth: Mucous membranes are moist.     Pharynx: No oropharyngeal exudate.  Eyes:     General: No visual field deficit or scleral icterus.       Right eye: No discharge.        Left eye: No discharge.     Conjunctiva/sclera: Conjunctivae normal.     Pupils: Pupils are equal, round, and reactive to light.     Comments: No nystagmus  Neck:     Thyroid: No thyromegaly.     Vascular: No carotid bruit or JVD.     Trachea: No tracheal deviation.     Comments: Some tenderness of R occiput area  No CS or muscular tenderness Nl rom of neck Cardiovascular:     Rate and  Rhythm: Normal rate and regular rhythm.     Heart sounds: Normal heart sounds. No murmur heard. Pulmonary:     Effort: Pulmonary effort is normal. No respiratory distress.     Breath sounds: Normal breath sounds. No wheezing or rales.  Abdominal:     General: Bowel sounds are normal. There is no distension.     Palpations: Abdomen is soft. There is no mass.     Tenderness: There is no abdominal tenderness.  Musculoskeletal:        General: No tenderness.     Cervical back: Full passive range  of motion without pain, normal range of motion and neck supple. No rigidity or tenderness.  Lymphadenopathy:     Cervical: No cervical adenopathy.  Skin:    General: Skin is warm and dry.     Coloration: Skin is not pale.     Findings: No rash.  Neurological:     Mental Status: She is alert and oriented to person, place, and time.     Cranial Nerves: Cranial nerves 2-12 are intact. No cranial nerve deficit, dysarthria or facial asymmetry.     Sensory: Sensation is intact. No sensory deficit.     Motor: No weakness, tremor, atrophy, abnormal muscle tone, seizure activity or pronator drift.     Coordination: Romberg sign negative. Coordination normal.     Gait: Gait and tandem walk normal.     Deep Tendon Reflexes: Reflexes are normal and symmetric. Reflexes normal.     Comments: No focal cerebellar signs   Psychiatric:        Behavior: Behavior normal.        Thought Content: Thought content normal.           Assessment & Plan:   Problem List Items Addressed This Visit       Other   Pain of occiput    Right side  Pain starts in R occiput and then spreads to R side of head in pt with h/o past migraine  Reassuring exam Suspect occipital neuralgia  Poss exac by grief/situational stress  Will try prednisone taper to break cycle Flexeril at bedtime prn Disc use of ice, good fluid intake and well fitting pillow  If not improved would consider imaging and trial of neurol med like  gabapentin  Update if not starting to improve in a week or if worsening   inst to watch for worse headache or new neuro symptoms       Relevant Medications   cyclobenzaprine (FLEXERIL) 10 MG tablet

## 2022-08-13 NOTE — Assessment & Plan Note (Addendum)
Right side  Pain starts in R occiput and then spreads to R side of head in pt with h/o past migraine  Reassuring exam Suspect occipital neuralgia  Poss exac by grief/situational stress  Will try prednisone taper to break cycle Flexeril at bedtime prn Disc use of ice, good fluid intake and well fitting pillow  If not improved would consider imaging and trial of neurol med like gabapentin  Update if not starting to improve in a week or if worsening   inst to watch for worse headache or new neuro symptoms

## 2022-08-26 DIAGNOSIS — M71341 Other bursal cyst, right hand: Secondary | ICD-10-CM | POA: Diagnosis not present

## 2022-08-26 DIAGNOSIS — L570 Actinic keratosis: Secondary | ICD-10-CM | POA: Diagnosis not present

## 2022-08-26 DIAGNOSIS — D225 Melanocytic nevi of trunk: Secondary | ICD-10-CM | POA: Diagnosis not present

## 2022-08-26 DIAGNOSIS — D2272 Melanocytic nevi of left lower limb, including hip: Secondary | ICD-10-CM | POA: Diagnosis not present

## 2022-08-26 DIAGNOSIS — D2262 Melanocytic nevi of left upper limb, including shoulder: Secondary | ICD-10-CM | POA: Diagnosis not present

## 2023-05-09 ENCOUNTER — Encounter: Payer: Self-pay | Admitting: Family Medicine

## 2023-05-09 ENCOUNTER — Ambulatory Visit (INDEPENDENT_AMBULATORY_CARE_PROVIDER_SITE_OTHER): Payer: BC Managed Care – PPO | Admitting: Family Medicine

## 2023-05-09 VITALS — BP 116/76 | HR 61 | Temp 97.7°F | Ht 67.25 in | Wt 132.4 lb

## 2023-05-09 DIAGNOSIS — S70361A Insect bite (nonvenomous), right thigh, initial encounter: Secondary | ICD-10-CM | POA: Insufficient documentation

## 2023-05-09 DIAGNOSIS — M25541 Pain in joints of right hand: Secondary | ICD-10-CM | POA: Diagnosis not present

## 2023-05-09 DIAGNOSIS — M7710 Lateral epicondylitis, unspecified elbow: Secondary | ICD-10-CM | POA: Insufficient documentation

## 2023-05-09 DIAGNOSIS — M7711 Lateral epicondylitis, right elbow: Secondary | ICD-10-CM | POA: Diagnosis not present

## 2023-05-09 DIAGNOSIS — M25542 Pain in joints of left hand: Secondary | ICD-10-CM | POA: Diagnosis not present

## 2023-05-09 DIAGNOSIS — M7712 Lateral epicondylitis, left elbow: Secondary | ICD-10-CM

## 2023-05-09 DIAGNOSIS — W57XXXS Bitten or stung by nonvenomous insect and other nonvenomous arthropods, sequela: Secondary | ICD-10-CM

## 2023-05-09 DIAGNOSIS — S70361S Insect bite (nonvenomous), right thigh, sequela: Secondary | ICD-10-CM | POA: Diagnosis not present

## 2023-05-09 DIAGNOSIS — M2550 Pain in unspecified joint: Secondary | ICD-10-CM | POA: Diagnosis not present

## 2023-05-09 DIAGNOSIS — W57XXXA Bitten or stung by nonvenomous insect and other nonvenomous arthropods, initial encounter: Secondary | ICD-10-CM | POA: Insufficient documentation

## 2023-05-09 LAB — CBC WITH DIFFERENTIAL/PLATELET
Basophils Relative: 0.4 %
HCT: 40 % (ref 35.0–45.0)
Lymphs Abs: 1349 cells/uL (ref 850–3900)
MCV: 90.5 fL (ref 80.0–100.0)

## 2023-05-09 NOTE — Assessment & Plan Note (Signed)
More than usual but without swelling /redness or heat  Elbows are worst  Some shoulder girdle pain  Tick bite (uneventful one mo ago) Mother had RA  Auto immune and tick labs today Reassuring exam Possible epicondylitis-discussed treatment

## 2023-05-09 NOTE — Assessment & Plan Note (Signed)
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

## 2023-05-09 NOTE — Assessment & Plan Note (Signed)
One month ago No rash or bullseye  No fever Does have increase joint pain since then   Labs ordered for tick illness

## 2023-05-09 NOTE — Progress Notes (Signed)
Subjective:    Patient ID: Alice Page, female    DOB: 1966/05/19, 57 y.o.   MRN: [id number]  HPI  Wt Readings from Last 3 Encounters:  05/09/23 132 lb 6 oz (60 kg)  08/13/22 139 lb 4 oz (63.2 kg)  02/28/22 139 lb (63 kg)   20.58 kg/m  Vitals:   05/09/23 1236  BP: 116/76  Pulse: 61  Temp: 97.7 F (36.5 C)  SpO2: 99%    Pt presents with c/o joint pain   Right elbow is worst, , followed by left elbow  Does not think it was swollen or red  Hurts to move them   Some shoulder girdle pain  Neck pain at times  Shoulder bother her  Knees occational hurt  Ankles are fine  Feet are fine   Some hand pain - old knot on her right middle finger (distal)- dermatology was not concerned  That nail grew out funny and finger is not perfectly straight   Not sensitive to touch   Did have old elbow trauma from a fall years ago   Is right handed  Uses mouse with that hand    No redness or heat of joints    Had tick bite one mo ago On right thigh  Not engorged  Tiny  Most likely bit her that day Got the whole thing off  No fever or rash  Joint pain has been worse since then   Exercise  No change  Walks - at least 10,000 steps per day  No strength training  No yoga     Lab Results  Component Value Date   ANA NEG 05/16/2014   RF <10 05/16/2014   Mother has RA   Not terribly fatigued   Has not taken anything for it   Not stiff in am      Patient Active Problem List   Diagnosis Date Noted   Tick bite of right thigh 05/09/2023   Lateral epicondylitis 05/09/2023   Pain of occiput 08/13/2022   Mass of foot 01/16/2022   Skin tag 01/16/2022   Finger swelling 01/16/2022   Groin swelling 01/23/2021   Lumbar back pain with radiculopathy affecting right lower extremity 04/07/2020   Disc disorder of lumbar region 04/07/2020   Chronic cough 04/21/2019   Tail bone pain 04/21/2019   Elevated glucose level 01/12/2018   Immunization reaction 01/26/2016    Colon cancer screening 12/19/2015   Routine general medical examination at a health care facility 12/07/2015   Multiple joint pain 05/16/2014   Migraine without aura, not refractory 07/31/2012   SYNCOPE 10/05/2007   INCREASED INTRAOCULAR PRESSURE 07/21/2007   Allergic rhinitis 07/21/2007   Past Medical History:  Diagnosis Date   Anemia    Heavy menses    History of allergic rhinitis    History of recurrent UTIs    Past Surgical History:  Procedure Laterality Date   LUMBAR LAMINECTOMY     Dr Lovell Sheehan   Social History   Tobacco Use   Smoking status: Never   Smokeless tobacco: Never  Substance Use Topics   Alcohol use: No    Alcohol/week: 0.0 standard drinks of alcohol   Drug use: No   Family History  Problem Relation Age of Onset   Rheum arthritis Mother    Allergies  Allergen Reactions   Cetirizine Hcl     REACTION: sedation   Ciprofloxacin     REACTION: joint pain   Tdap [Tetanus-Diphth-Acell Pertussis] Other (See Comments)  Arm pain   No current outpatient medications on file prior to visit.   No current facility-administered medications on file prior to visit.    Review of Systems  Constitutional:  Negative for activity change, appetite change, fatigue, fever and unexpected weight change.  HENT:  Negative for congestion, ear pain, rhinorrhea, sinus pressure and sore throat.   Eyes:  Negative for pain, redness and visual disturbance.  Respiratory:  Negative for cough, shortness of breath and wheezing.   Cardiovascular:  Negative for chest pain and palpitations.  Gastrointestinal:  Negative for abdominal pain, blood in stool, constipation and diarrhea.  Endocrine: Negative for polydipsia and polyuria.  Genitourinary:  Negative for dysuria, frequency and urgency.  Musculoskeletal:  Positive for arthralgias, myalgias and neck pain. Negative for back pain.  Skin:  Negative for color change, pallor, rash and wound.       Had a tick bite    Allergic/Immunologic: Negative for environmental allergies.  Neurological:  Negative for dizziness, syncope and headaches.  Hematological:  Negative for adenopathy. Does not bruise/bleed easily.  Psychiatric/Behavioral:  Negative for decreased concentration and dysphoric mood. The patient is not nervous/anxious.        Objective:   Physical Exam Constitutional:      General: She is not in acute distress.    Appearance: Normal appearance. She is well-developed and normal weight. She is not ill-appearing or diaphoretic.  HENT:     Head: Normocephalic and atraumatic.  Eyes:     Conjunctiva/sclera: Conjunctivae normal.     Pupils: Pupils are equal, round, and reactive to light.  Neck:     Thyroid: No thyromegaly.     Vascular: No carotid bruit or JVD.  Cardiovascular:     Rate and Rhythm: Normal rate and regular rhythm.     Heart sounds: Normal heart sounds.     No gallop.  Pulmonary:     Effort: Pulmonary effort is normal. No respiratory distress.     Breath sounds: Normal breath sounds. No wheezing or rales.  Abdominal:     General: There is no distension or abdominal bruit.     Palpations: Abdomen is soft.  Musculoskeletal:     Cervical back: Normal range of motion and neck supple.     Right lower leg: No edema.     Left lower leg: No edema.     Comments: No acute joint changes No crepitus  Tender over lateral epicondyle bilaterally -worse on right Normal rom elbows     Lymphadenopathy:     Cervical: No cervical adenopathy.  Skin:    General: Skin is warm and dry.     Coloration: Skin is not pale.     Findings: No erythema or rash.     Comments: Tanned Solar lentigines diffusely No rashes   Site of previous tick bite on right thigh is gone   Neurological:     Mental Status: She is alert.     Coordination: Coordination normal.     Deep Tendon Reflexes: Reflexes are normal and symmetric. Reflexes normal.  Psychiatric:        Mood and Affect: Mood normal.            Assessment & Plan:   Problem List Items Addressed This Visit       Musculoskeletal and Integument   Tick bite of right thigh    One month ago No rash or bullseye  No fever Does have increase joint pain since then   Labs ordered for  tick illness      Relevant Orders   B. 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 burgdorfi antibodies by WB   Rocky mtn spotted fvr abs pnl(IgG+IgM)   ANA,IFA RA Diag Pnl w/rflx Tit/Patn

## 2023-05-09 NOTE — Patient Instructions (Addendum)
Auto immune joint labs today including ANA and rheumatoid factor  Also tick labs    Look at handout on tennis elbow  Use cold compress (can freeze a paper cup with water ) for 10 minutes  Get voltaren gel over the counter 1% - try it up to four times daily   Change desk layout /mouse location whenever possible   Update if not starting to improve in a week or if worsening    Tylenol is ok if needed

## 2023-05-10 LAB — CBC WITH DIFFERENTIAL/PLATELET
Absolute Monocytes: 465 cells/uL (ref 200–950)
Basophils Absolute: 19 cells/uL (ref 0–200)
MCHC: 33.3 g/dL (ref 32.0–36.0)
MPV: 9.6 fL (ref 7.5–12.5)
Monocytes Relative: 9.9 %
Neutro Abs: 2806 cells/uL (ref 1500–7800)
Neutrophils Relative %: 59.7 %
RDW: 12.1 % (ref 11.0–15.0)
WBC: 4.7 10*3/uL (ref 3.8–10.8)

## 2023-05-10 LAB — SEDIMENTATION RATE: Sed Rate: 6 mm/h (ref 0–30)

## 2023-05-10 LAB — TSH: TSH: 2.52 mIU/L (ref 0.40–4.50)

## 2023-05-13 LAB — B. BURGDORFI ANTIBODIES BY WB
Lyme Disease 18 kD IgG: NONREACTIVE
Lyme Disease 23 kD IgG: NONREACTIVE
Lyme Disease 23 kD IgM: NONREACTIVE
Lyme Disease 30 kD IgG: NONREACTIVE
Lyme Disease 39 kD IgG: NONREACTIVE
Lyme Disease 39 kD IgM: NONREACTIVE
Lyme Disease 41 kD IgG: NONREACTIVE
Lyme Disease 41 kD IgM: NONREACTIVE
Lyme Disease 58 kD IgG: NONREACTIVE

## 2023-05-13 LAB — CBC WITH DIFFERENTIAL/PLATELET
Eosinophils Absolute: 61 cells/uL (ref 15–500)
MCH: 30.1 pg (ref 27.0–33.0)
RBC: 4.42 10*6/uL (ref 3.80–5.10)
Total Lymphocyte: 28.7 %

## 2023-05-14 LAB — B. BURGDORFI ANTIBODIES BY WB
B burgdorferi IgG Abs (IB): NEGATIVE
B burgdorferi IgM Abs (IB): NEGATIVE
Lyme Disease 28 kD IgG: NONREACTIVE
Lyme Disease 45 kD IgG: NONREACTIVE
Lyme Disease 66 kD IgG: NONREACTIVE
Lyme Disease 93 kD IgG: NONREACTIVE

## 2023-05-14 LAB — CBC WITH DIFFERENTIAL/PLATELET
Eosinophils Relative: 1.3 %
Hemoglobin: 13.3 g/dL (ref 11.7–15.5)
Platelets: 220 10*3/uL (ref 140–400)

## 2023-05-14 LAB — ANA,IFA RA DIAG PNL W/RFLX TIT/PATN
Anti Nuclear Antibody (ANA): NEGATIVE
Cyclic Citrullin Peptide Ab: <16 UNITS
Rheumatoid fact SerPl-aCnc: <10 IU/mL (ref <14)

## 2023-05-14 LAB — ROCKY MTN SPOTTED FVR ABS PNL(IGG+IGM)
RMSF IgG: NOT DETECTED
RMSF IgM: NOT DETECTED

## 2023-07-15 DIAGNOSIS — Z130 Encounter for screening for diseases of the blood and blood-forming organs and certain disorders involving the immune mechanism: Secondary | ICD-10-CM | POA: Diagnosis not present

## 2023-07-15 DIAGNOSIS — Z01419 Encounter for gynecological examination (general) (routine) without abnormal findings: Secondary | ICD-10-CM | POA: Diagnosis not present

## 2023-07-15 DIAGNOSIS — Z1231 Encounter for screening mammogram for malignant neoplasm of breast: Secondary | ICD-10-CM | POA: Diagnosis not present

## 2023-07-15 LAB — HM MAMMOGRAPHY

## 2023-07-25 ENCOUNTER — Other Ambulatory Visit: Payer: Self-pay | Admitting: Family Medicine

## 2023-07-25 DIAGNOSIS — Z1212 Encounter for screening for malignant neoplasm of rectum: Secondary | ICD-10-CM

## 2023-07-25 DIAGNOSIS — Z1211 Encounter for screening for malignant neoplasm of colon: Secondary | ICD-10-CM

## 2023-10-20 ENCOUNTER — Ambulatory Visit: Payer: Self-pay | Admitting: Family Medicine

## 2023-10-20 NOTE — Telephone Encounter (Signed)
Copied from CRM [id number]. Topic: Clinical - Red Word Triage >> Oct 20, 2023  4:16 PM Alvino Blood C wrote: Red Word that prompted transfer to Nurse Triage: Patient says she fell and has bumped both her knees up  Chief Complaint: Fall, knee pain Symptoms: Bilateral knee pain, numbness to bottom of feet Frequency: Single fall Pertinent Negatives: Patient denies any other injury or complaint.  Disposition: [] ED /[] Urgent Care (no appt availability in office) / [x] Appointment(In office/virtual)/ []  [facility]/ [] Home Care/ [] Refused Recommended Disposition /[] [facility]/ []  Follow-up with PCP Additional Notes: Patient reports she fell a few weeks ago, stating she tripped and landed on the sidewalk. She reports that when she fell she scraped both of her knees. She reports that she was feeling better but has had increased pain for the last few days and is also now experiencing numbness ot the bottom of her feet. Patient requesting office visit for evaluation.    Reason for Disposition  [1] After 2 weeks AND [2] still painful or swollen  Answer Assessment - Initial Assessment Questions 1. MECHANISM: "How did the fall happen?"     Tripped 2. DOMESTIC VIOLENCE AND ELDER ABUSE SCREENING: "Did you fall because someone pushed you or tried to hurt you?" If Yes, ask: "Are you safe now?"     No 3. ONSET: "When did the fall happen?" (e.g., minutes, hours, or days ago)     "A few weeks ago" 4. LOCATION: "What part of the body hit the ground?" (e.g., back, buttocks, head, hips, knees, hands, head, stomach)     Bilateral knees 5. INJURY: "Did you hurt (injure) yourself when you fell?" If Yes, ask: "What did you injure? Tell me more about this?" (e.g., body area; type of injury; pain severity)"     Yes, bilateral knees 6. PAIN: "Is there any pain?" If Yes, ask: "How bad is the pain?" (e.g., Scale 1-10; or mild,  moderate, severe)   - NONE (0): No pain   - MILD (1-3): Doesn't interfere  with normal activities    - MODERATE (4-7): Interferes with normal activities or awakens from sleep    - SEVERE (8-10): Excruciating pain, unable to do any normal activities      1/10 7. SIZE: For cuts, bruises, or swelling, ask: "How large is it?" (e.g., inches or centimeters)      Half dollar sized and nickel sized scabs from fall.  8. PREGNANCY: "Is there any chance you are pregnant?" "When was your last menstrual period?"     No 9. OTHER SYMPTOMS: "Do you have any other symptoms?" (e.g., dizziness, fever, weakness; new onset or worsening).      Numbness to bottom of feet 10. CAUSE: "What do you think caused the fall (or falling)?" (e.g., tripped, dizzy spell)       Tripped  Answer Assessment - Initial Assessment Questions 1. MECHANISM: "How did the injury happen?" (e.g., twisting injury, direct blow)      Fall 2. ONSET: "When did the injury happen?" (Minutes or hours ago)      "A few weeks ago" 3. LOCATION: "Where is the injury located?"      Bilateral knees 4. APPEARANCE of INJURY: "What does the injury look like?"      Scabs on knees 5. SEVERITY: "Can you put weight on that leg?" "Can you walk?"      Mild 6. SIZE: For cuts, bruises, or swelling, ask: "How large is it?" (e.g., inches or centimeters;  entire joint)  Half dollar sized and nickel sized scabs 7. PAIN: "Is there pain?" If Yes, ask: "How bad is the pain?"  "What does it keep you from doing?" (e.g., Scale 1-10; or mild, moderate, severe)   -  NONE: (0): no pain   -  MILD (1-3): doesn't interfere with normal activities    -  MODERATE (4-7): interferes with normal activities (e.g., work or school) or awakens from sleep, limping    -  SEVERE (8-10): excruciating pain, unable to do any normal activities, unable to walk     1/10 9. OTHER SYMPTOMS: "Do you have any other symptoms?"  (e.g., "pop" when knee injured, swelling, locking, buckling)      Numbness to bottom of feet 10. PREGNANCY: "Is there any chance you are  pregnant?" "When was your last menstrual period?"       No  Protocols used: Falls and Falling-A-AH, Knee Injury-A-AH

## 2023-10-20 NOTE — Telephone Encounter (Signed)
Pt has appt with Dr. Patsy Lager on 10/23/23  FYI

## 2023-10-22 NOTE — Progress Notes (Signed)
 Kariyah Baugh T. Randye Treichler, MD, CAQ Sports Medicine [facility] at [facility] [address] [place], [address]  Phone: [phone]  FAX: [phone]  Alice Page - 58 y.o. female  MRN [id number]  Date of Birth: 04-Aug-1966  Date: 10/23/2023  PCP: Randeen Laine LABOR, MD  Referral: Randeen Laine LABOR, MD  Chief Complaint  Patient presents with   Fall    3 1/2 weeks ago   Knee Pain    Bilateral    Numbness    Bottom of feet   Subjective:   Alice Page is a 58 y.o. very pleasant female patient with Body mass index is 20.21 kg/m. who presents with the following:  The patient presents with bilateral knee pain after a fall several weeks ago with persistent pain.  Fell knees down to the concrete sidewalk Was doing really well and good progress, but recently over the last week or so, the pain has worsened.  Then on christmas eve, on the floor with her grandchildren.  Since then, she has continued to have some worsened pain compared to how she had been doing a couple of weeks in the past. She has pain at the distal medial femur as well as the medial tibial plateau region.  She has not had any bruising.  She is ambulating without much significant difficulty, but she is having a lot of problems crouching and getting up from a seated position as well as the floor.  She does have a history of prior distant lumbar laminectomy from a few years ago, however she has not had any significant knee injuries in the past.  She also has right now some occasional numbness on the toes and on the bottom of the feet.  She has some right posterior buttocks tenderness which is been present for an extended period of time.  She also has some right-sided thigh numbness which has been present since her initial back problems in 2021.  Review of Systems is noted in the HPI, as appropriate  Objective:   BP (!) 167/86 (BP Location: Left Arm, Patient Position: Sitting, Cuff Size:  Normal)   Pulse 71   Temp 98.2 F (36.8 C) (Temporal)   Ht 5' 7.25 (1.708 m)   Wt 130 lb (59 kg)   LMP 08/28/2013   SpO2 99%   BMI 20.21 kg/m   GEN: No acute distress; alert,appropriate. PULM: Breathing comfortably in no respiratory distress PSYCH: Normally interactive.   Left knee: Full extension and flexion to 135.  Excellent patellar motion.  No effusion. Stable to varus and valgus stress No joint line tenderness No pain with McMurray's, flexion pinch, or bounce home testing.  Nontender at the bony anatomy throughout on the distal femur and proximal tibia.  Nontender with compression of the tibia and fibula throughout.  Right knee: Full extension and flexion to 135.  No effusion No significant tenderness with patellar manipulation no tenderness at the patellar quadricep tendons No tenderness along the course of the fibula At the proximal tibia near the tibial plateau the patient does have tenderness to direct palpation She also has tenderness to direct palpation at the medial femoral condyle Minimal tenderness at the joint lines medially or laterally No significant tenderness or symptoms with McMurray's, flexion pinch testing, or bounce home testing.  Laboratory and Imaging Data:  Assessment and Plan:     ICD-10-CM   1. Acute pain of right knee  M25.561     2. Acute bilateral knee  pain  M25.561 DG Knee Complete 4 Views Right   M25.562     3. Bone bruise  T14.8XXA      Total encounter time: 30 minutes. This includes total time spent on the day of encounter.  Chart review, discussion with patient, independent review of films. On plain x-ray, I do not see a fracture and she has minimal significant arthritic changes.  On exam and history, think the patient has some significant bone contusion at the medial femoral condyle as well as the tibial plateau.  I am going to make her partial weightbearing using crutches.  If she is unable to use crutches, she could also use a  walker as an alternative.  I tried to fit her with a hinged knee brace, however the hinge was adjacent to the medial knee and caused her some additional pain.  Therefore, I am going to have her on brace then use a offloading crutch.  I would like for her to remain partial weightbearing for the next 3 to 4 weeks.  If symptoms persist at 6 weeks, recommend MRI of the knee.  Medication Management during today's office visit: No orders of the defined types were placed in this encounter.  There are no discontinued medications.  Orders placed today for conditions managed today: Orders Placed This Encounter  Procedures   DG Knee Complete 4 Views Right    Disposition: No follow-ups on file.  [employer] One speech-to-text software was used for transcription in this dictation.  Possible transcriptional errors can occur using Animal nutritionist.   Signed,  Jacques DASEN. Zoee Heeney, MD   No outpatient encounter medications on file as of 10/23/2023.   No facility-administered encounter medications on file as of 10/23/2023.

## 2023-10-23 ENCOUNTER — Ambulatory Visit: Payer: BC Managed Care – PPO | Admitting: Family Medicine

## 2023-10-23 ENCOUNTER — Ambulatory Visit
Admission: RE | Admit: 2023-10-23 | Discharge: 2023-10-23 | Disposition: A | Discharge status: Home / Self Care | Payer: BC Managed Care – PPO | Source: Ambulatory Visit | Attending: Family Medicine | Admitting: Family Medicine

## 2023-10-23 ENCOUNTER — Encounter: Payer: Self-pay | Admitting: Family Medicine

## 2023-10-23 VITALS — BP 167/86 | HR 71 | Temp 98.2°F | Ht 67.25 in | Wt 130.0 lb

## 2023-10-23 DIAGNOSIS — M85861 Other specified disorders of bone density and structure, right lower leg: Secondary | ICD-10-CM | POA: Diagnosis not present

## 2023-10-23 DIAGNOSIS — M25561 Pain in right knee: Secondary | ICD-10-CM | POA: Diagnosis not present

## 2023-10-23 DIAGNOSIS — M25562 Pain in left knee: Secondary | ICD-10-CM

## 2023-10-23 DIAGNOSIS — T148XXA Other injury of unspecified body region, initial encounter: Secondary | ICD-10-CM

## 2023-10-23 DIAGNOSIS — Z0389 Encounter for observation for other suspected diseases and conditions ruled out: Secondary | ICD-10-CM | POA: Diagnosis not present

## 2023-12-31 ENCOUNTER — Encounter: Payer: Self-pay | Admitting: Family Medicine

## 2024-01-11 ENCOUNTER — Telehealth: Payer: Self-pay | Admitting: Family Medicine

## 2024-01-11 DIAGNOSIS — Z0000 Encounter for general adult medical examination without abnormal findings: Secondary | ICD-10-CM

## 2024-01-11 DIAGNOSIS — R7309 Other abnormal glucose: Secondary | ICD-10-CM

## 2024-01-11 NOTE — Telephone Encounter (Signed)
-----   Message from Alvina Chou sent at 01/02/2024  9:43 AM EDT ----- Regarding: Lab orders for MON, 3.24.25 Patient is scheduled for CPX labs, please order future labs, Thanks , Camelia Eng

## 2024-01-12 ENCOUNTER — Other Ambulatory Visit (INDEPENDENT_AMBULATORY_CARE_PROVIDER_SITE_OTHER)

## 2024-01-12 DIAGNOSIS — R7309 Other abnormal glucose: Secondary | ICD-10-CM

## 2024-01-12 DIAGNOSIS — Z0000 Encounter for general adult medical examination without abnormal findings: Secondary | ICD-10-CM

## 2024-01-12 LAB — COMPREHENSIVE METABOLIC PANEL
ALT: 13 U/L (ref 0–35)
AST: 18 U/L (ref 0–37)
Albumin: 4.7 g/dL (ref 3.5–5.2)
Alkaline Phosphatase: 45 U/L (ref 39–117)
BUN: 14 mg/dL (ref 6–23)
CO2: 28 meq/L (ref 19–32)
Calcium: 9.8 mg/dL (ref 8.4–10.5)
Chloride: 103 meq/L (ref 96–112)
Creatinine, Ser: 0.7 mg/dL (ref 0.40–1.20)
GFR: 95.21 mL/min (ref >60.00)
Glucose, Bld: 97 mg/dL (ref 70–99)
Potassium: 4.2 meq/L (ref 3.5–5.1)
Sodium: 139 meq/L (ref 135–145)
Total Bilirubin: 0.9 mg/dL (ref 0.2–1.2)
Total Protein: 7.6 g/dL (ref 6.0–8.3)

## 2024-01-12 LAB — CBC WITH DIFFERENTIAL/PLATELET
Basophils Absolute: 0 10*3/uL (ref 0.0–0.1)
Basophils Relative: 0.7 % (ref 0.0–3.0)
Eosinophils Absolute: 0.1 10*3/uL (ref 0.0–0.7)
Eosinophils Relative: 2.4 % (ref 0.0–5.0)
HCT: 39.3 % (ref 36.0–46.0)
Hemoglobin: 13.1 g/dL (ref 12.0–15.0)
Lymphocytes Relative: 28.4 % (ref 12.0–46.0)
Lymphs Abs: 1.5 10*3/uL (ref 0.7–4.0)
MCHC: 33.4 g/dL (ref 30.0–36.0)
MCV: 91.5 fl (ref 78.0–100.0)
Monocytes Absolute: 0.4 10*3/uL (ref 0.1–1.0)
Monocytes Relative: 8 % (ref 3.0–12.0)
Neutro Abs: 3.1 10*3/uL (ref 1.4–7.7)
Neutrophils Relative %: 60.5 % (ref 43.0–77.0)
Platelets: 214 10*3/uL (ref 150.0–400.0)
RBC: 4.29 Mil/uL (ref 3.87–5.11)
RDW: 13.3 % (ref 11.5–15.5)
WBC: 5.2 10*3/uL (ref 4.0–10.5)

## 2024-01-12 LAB — LIPID PANEL
Cholesterol: 208 mg/dL — ABNORMAL HIGH (ref 0–200)
HDL: 96.5 mg/dL (ref >39.00)
LDL Cholesterol: 97 mg/dL (ref 0–99)
NonHDL: 111.47
Total CHOL/HDL Ratio: 2
Triglycerides: 72 mg/dL (ref 0.0–149.0)
VLDL: 14.4 mg/dL (ref 0.0–40.0)

## 2024-01-12 LAB — HEMOGLOBIN A1C: Hgb A1c MFr Bld: 5.6 % (ref 4.6–6.5)

## 2024-01-12 LAB — TSH: TSH: 1.66 u[IU]/mL (ref 0.35–5.50)

## 2024-01-19 ENCOUNTER — Encounter: Payer: Self-pay | Admitting: Family Medicine

## 2024-01-19 ENCOUNTER — Ambulatory Visit (INDEPENDENT_AMBULATORY_CARE_PROVIDER_SITE_OTHER): Admitting: Family Medicine

## 2024-01-19 VITALS — BP 128/78 | HR 62 | Temp 98.3°F | Ht 67.0 in | Wt 131.2 lb

## 2024-01-19 DIAGNOSIS — R7309 Other abnormal glucose: Secondary | ICD-10-CM

## 2024-01-19 DIAGNOSIS — Z0000 Encounter for general adult medical examination without abnormal findings: Secondary | ICD-10-CM | POA: Diagnosis not present

## 2024-01-19 DIAGNOSIS — Z1211 Encounter for screening for malignant neoplasm of colon: Secondary | ICD-10-CM | POA: Diagnosis not present

## 2024-01-19 NOTE — Assessment & Plan Note (Signed)
 Reviewed health habits including diet and exercise and skin cancer prevention Reviewed appropriate screening tests for age  Also reviewed health mt list, fam hx and immunization status , as well as social and family history   See HPI Labs reviewed and ordered Health Maintenance  Topic Date Due   Cologuard (Stool DNA test)  Never done   Pap with HPV screening  11/14/2023   Flu Shot  01/19/2024*   Zoster (Shingles) Vaccine (1 of 2) 04/19/2024*   Hepatitis C Screening  01/18/2025*   HIV Screening  01/18/2025*   COVID-19 Vaccine (1) 02/03/2026*   Mammogram  07/14/2025   DTaP/Tdap/Td vaccine (2 - Td or Tdap) 01/17/2026   HPV Vaccine  Aged Out  *Topic was postponed. The date shown is not the original due date.   Declines colon cancer screening  Planning gyn provider visit for pap and exam Declines flu and shingrix vaccines Discussed fall prevention, supplements and exercise for bone density  Utd derm care and eye care PHQ 0

## 2024-01-19 NOTE — Assessment & Plan Note (Signed)
 Lab Results  Component Value Date   HGBA1C 5.6 01/12/2024   HGBA1C 5.5 01/17/2021   HGBA1C 5.6 04/07/2019   disc imp of low glycemic diet and wt loss to prevent DM2

## 2024-01-19 NOTE — Patient Instructions (Addendum)
 Find an exercise you can do  Add some strength training to your routine, this is important for bone and brain health and can reduce your risk of falls and help your body use insulin properly and regulate weight  Light weights, exercise bands , and internet videos are a good way to start  Yoga (chair or regular), machines , floor exercises or a gym with machines are also good options   For bone health  Try to get 1200-1500 mg of calcium per day with at least 2000 iu of vitamin D  If you tend to be constipated- the D by itself

## 2024-01-19 NOTE — Progress Notes (Signed)
 Subjective:    Patient ID: Alice Page, female    DOB: [birth year], 58 y.o.   MRN: [id number]  HPI  Here for health maintenance exam and to review chronic medical problems   Wt Readings from Last 3 Encounters:  01/19/24 131 lb 4 oz (59.5 kg)  10/23/23 130 lb (59 kg)  05/09/23 132 lb 6 oz (60 kg)   20.56 kg/m  Vitals:   01/19/24 1556  BP: 128/78  Pulse: 62  Temp: 98.3 F (36.8 C)  SpO2: 99%    Immunization History  Administered Date(s) Administered   Tdap 01/18/2016    Health Maintenance Due  Topic Date Due   Fecal DNA (Cologuard)  Never done   Cervical Cancer Screening (HPV/Pap Cotest)  11/14/2023   Feels ok    Does not get flu shots   Shingrix- declines     Mammogram 06/2023  Self breast exam- no lumps or changes but does not always check   Gyn health-planning visit  Due for a visit  Normal pap 10/2018    Colon cancer screening  Declines any screening    Bone health  Falls-one , had to see Dr Patsy Lager // fell on knees on concrete  Tripped over a cord outside at a birthday party  Fractures- none  Clumsy person in general - her whole life  Supplements -none  Last vitamin D Lab Results  Component Value Date   VD25OH 31.58 05/16/2014    Exercise :  No regular  Is very active    8-10,000 steps per day anyway   Is weaker after her back surgery   Up to date derm care Has appointment this summer     Mood    01/19/2024    3:59 PM 10/23/2023   12:20 PM 01/23/2021    3:40 PM 04/21/2019    3:48 PM 08/06/2017    4:10 PM  Depression screen PHQ 2/9  Decreased Interest 0 0 0 0 0  Down, Depressed, Hopeless 0 0 0 0 0  PHQ - 2 Score 0 0 0 0 0  Altered sleeping 0  0    Tired, decreased energy 0  0    Change in appetite 0  0    Feeling bad or failure about yourself  0  0    Trouble concentrating 0  0    Moving slowly or fidgety/restless 0  0    Suicidal thoughts 0  0    PHQ-9 Score 0  0    Difficult doing work/chores Not difficult at all   Not difficult at all      Glucose  Lab Results  Component Value Date   HGBA1C 5.6 01/12/2024   HGBA1C 5.5 01/17/2021   HGBA1C 5.6 04/07/2019     Cholesterol screen  Lab Results  Component Value Date   CHOL 208 (H) 01/12/2024   CHOL 193 01/17/2021   CHOL 187 04/07/2019   Lab Results  Component Value Date   HDL 96.50 01/12/2024   HDL 89.80 01/17/2021   HDL 81.40 04/07/2019   Lab Results  Component Value Date   LDLCALC 97 01/12/2024   LDLCALC 92 01/17/2021   LDLCALC 93 04/07/2019   Lab Results  Component Value Date   TRIG 72.0 01/12/2024   TRIG 55.0 01/17/2021   TRIG 65.0 04/07/2019   Lab Results  Component Value Date   CHOLHDL 2 01/12/2024   CHOLHDL 2 01/17/2021   CHOLHDL 2 04/07/2019   No results found for: "LDLDIRECT"  Is a healthy eater   Craves sweets - has to be mindful of that  Eats fruit instead of sweets when she can     Patient Active Problem List   Diagnosis Date Noted   Skin tag 01/16/2022   Lumbar back pain with radiculopathy affecting right lower extremity 04/07/2020   Disc disorder of lumbar region 04/07/2020   Elevated glucose level 01/12/2018   Colon cancer screening 12/19/2015   Routine general medical examination at a health care facility 12/07/2015   Multiple joint pain 05/16/2014   Migraine without aura, not refractory 07/31/2012   Preglaucoma 07/21/2007   Allergic rhinitis 07/21/2007   Past Medical History:  Diagnosis Date   Anemia    Heavy menses    History of allergic rhinitis    History of recurrent UTIs    Past Surgical History:  Procedure Laterality Date   LUMBAR LAMINECTOMY     Dr Lovell Sheehan   Social History   Tobacco Use   Smoking status: Never   Smokeless tobacco: Never  Substance Use Topics   Alcohol use: No    Alcohol/week: 0.0 standard drinks of alcohol   Drug use: No   Family History  Problem Relation Age of Onset   Rheum arthritis Mother    Allergies  Allergen Reactions   Cetirizine Hcl      REACTION: sedation   Ciprofloxacin     REACTION: joint pain   Tdap [Tetanus-Diphth-Acell Pertussis] Other (See Comments)    Arm pain   Current Outpatient Medications on File Prior to Visit  Medication Sig Dispense Refill   fexofenadine (ALLEGRA) 60 MG tablet Take 60 mg by mouth daily as needed for allergies or rhinitis.     No current facility-administered medications on file prior to visit.    Review of Systems  Constitutional:  Negative for activity change, appetite change, fatigue, fever and unexpected weight change.  HENT:  Negative for congestion, ear pain, rhinorrhea, sinus pressure and sore throat.   Eyes:  Negative for pain, redness and visual disturbance.  Respiratory:  Negative for cough, shortness of breath and wheezing.   Cardiovascular:  Negative for chest pain and palpitations.  Gastrointestinal:  Negative for abdominal pain, blood in stool, constipation and diarrhea.  Endocrine: Negative for polydipsia and polyuria.  Genitourinary:  Negative for dysuria, frequency and urgency.  Musculoskeletal:  Negative for arthralgias, back pain and myalgias.  Skin:  Negative for pallor and rash.  Allergic/Immunologic: Negative for environmental allergies.  Neurological:  Negative for dizziness, syncope and headaches.  Hematological:  Negative for adenopathy. Does not bruise/bleed easily.  Psychiatric/Behavioral:  Negative for decreased concentration and dysphoric mood. The patient is not nervous/anxious.        Objective:   Physical Exam Constitutional:      General: She is not in acute distress.    Appearance: Normal appearance. She is well-developed and normal weight. She is not ill-appearing or diaphoretic.  HENT:     Head: Normocephalic and atraumatic.     Right Ear: Tympanic membrane, ear canal and external ear normal.     Left Ear: Tympanic membrane, ear canal and external ear normal.     Nose: Nose normal. No congestion.     Mouth/Throat:     Mouth: Mucous membranes  are moist.     Pharynx: Oropharynx is clear. No posterior oropharyngeal erythema.  Eyes:     General: No scleral icterus.    Extraocular Movements: Extraocular movements intact.     Conjunctiva/sclera: Conjunctivae normal.  Pupils: Pupils are equal, round, and reactive to light.  Neck:     Thyroid: No thyromegaly.     Vascular: No carotid bruit or JVD.  Cardiovascular:     Rate and Rhythm: Normal rate and regular rhythm.     Pulses: Normal pulses.     Heart sounds: Normal heart sounds.     No gallop.  Pulmonary:     Effort: Pulmonary effort is normal. No respiratory distress.     Breath sounds: Normal breath sounds. No wheezing.     Comments: Good air exch Chest:     Chest wall: No tenderness.  Abdominal:     General: Bowel sounds are normal. There is no distension or abdominal bruit.     Palpations: Abdomen is soft. There is no mass.     Tenderness: There is no abdominal tenderness.     Hernia: No hernia is present.  Genitourinary:    Comments: Breast exam: No mass, nodules, thickening, tenderness, bulging, retraction, inflamation, nipple discharge or skin changes noted.  No axillary or clavicular LA.     Musculoskeletal:        General: No tenderness. Normal range of motion.     Cervical back: Normal range of motion and neck supple. No rigidity. No muscular tenderness.     Right lower leg: No edema.     Left lower leg: No edema.     Comments: No kyphosis   Lymphadenopathy:     Cervical: No cervical adenopathy.  Skin:    General: Skin is warm and dry.     Coloration: Skin is not pale.     Findings: No erythema or rash.     Comments: Solar lentigines diffusely   Neurological:     Mental Status: She is alert. Mental status is at baseline.     Cranial Nerves: No cranial nerve deficit.     Motor: No abnormal muscle tone.     Coordination: Coordination normal.     Gait: Gait normal.     Deep Tendon Reflexes: Reflexes are normal and symmetric. Reflexes normal.   Psychiatric:        Mood and Affect: Mood normal.        Cognition and Memory: Cognition and memory normal.           Assessment & Plan:   Problem List Items Addressed This Visit       Other   Routine general medical examination at a health care facility - Primary   Reviewed health habits including diet and exercise and skin cancer prevention Reviewed appropriate screening tests for age  Also reviewed health mt list, fam hx and immunization status , as well as social and family history   See HPI Labs reviewed and ordered Health Maintenance  Topic Date Due   Cologuard (Stool DNA test)  Never done   Pap with HPV screening  11/14/2023   Flu Shot  01/19/2024*   Zoster (Shingles) Vaccine (1 of 2) 04/19/2024*   Hepatitis C Screening  01/18/2025*   HIV Screening  01/18/2025*   COVID-19 Vaccine (1) 02/03/2026*   Mammogram  07/14/2025   DTaP/Tdap/Td vaccine (2 - Td or Tdap) 01/17/2026   HPV Vaccine  Aged Out  *Topic was postponed. The date shown is not the original due date.   Declines colon cancer screening  Planning gyn provider visit for pap and exam Declines flu and shingrix vaccines Discussed fall prevention, supplements and exercise for bone density  Utd derm care and eye  care PHQ 0       Elevated glucose level   Lab Results  Component Value Date   HGBA1C 5.6 01/12/2024   HGBA1C 5.5 01/17/2021   HGBA1C 5.6 04/07/2019   disc imp of low glycemic diet and wt loss to prevent DM2       Colon cancer screening   Pt declines colon cancer screening of any kind  Discussed options for the future  Will let us know if she changes her mind

## 2024-01-19 NOTE — Assessment & Plan Note (Signed)
 Pt declines colon cancer screening of any kind  Discussed options for the future  Will let us know if she changes her mind

## 2024-03-23 DIAGNOSIS — H524 Presbyopia: Secondary | ICD-10-CM | POA: Diagnosis not present

## 2024-03-31 ENCOUNTER — Encounter: Payer: Self-pay | Admitting: Family Medicine

## 2024-03-31 NOTE — Telephone Encounter (Signed)
 Do we have a ring cutter in the office if needed ?   Any signs of infection - redness/pain or skin breakdown   Has she tried  Cold compress/soak on cold water Vaseline or oil to lubricate  Elevation ?

## 2024-04-01 NOTE — Telephone Encounter (Signed)
 Yes Dr Malissa Se, we do have a ring cutter.

## 2024-04-01 NOTE — Telephone Encounter (Addendum)
 Spoke with pt relaying Dr Belva Boyden message. Pt verbalizes understanding. However, she has contacted her local jeweler and was told they will cut rings off for her. So she is headed there. Encouraged pt to be seen at [facility] if swelling does not resolve and/or she sees discoloration in fingers. Pt verbalizes understanding and expresses thanks for call back. Fyi to Dr Malissa Se.

## 2024-04-01 NOTE — Telephone Encounter (Signed)
 Fyi to Dr Malissa Se, we are checking to see if we have a ring cutter.    Lvm asking pt to call back. Need to get answers to Dr Belva Boyden questions.   I also sent a MyChart message.

## 2024-04-01 NOTE — Telephone Encounter (Signed)
 I am out of the office until Monday This may need urgent attention - [facility] if unable to get her in here  So sorry

## 2024-05-05 DIAGNOSIS — D2261 Melanocytic nevi of right upper limb, including shoulder: Secondary | ICD-10-CM | POA: Diagnosis not present

## 2024-05-05 DIAGNOSIS — D225 Melanocytic nevi of trunk: Secondary | ICD-10-CM | POA: Diagnosis not present

## 2024-05-05 DIAGNOSIS — D2262 Melanocytic nevi of left upper limb, including shoulder: Secondary | ICD-10-CM | POA: Diagnosis not present

## 2024-05-05 DIAGNOSIS — L821 Other seborrheic keratosis: Secondary | ICD-10-CM | POA: Diagnosis not present

## 2024-06-15 ENCOUNTER — Ambulatory Visit: Admitting: Family Medicine

## 2024-09-10 ENCOUNTER — Telehealth: Payer: Self-pay | Admitting: Family Medicine

## 2024-09-10 NOTE — Telephone Encounter (Addendum)
 It's amoxicillin  500 mg caps that the dentist has her taking TID  Pt started it this Sunday 09/05/24 after calling dentist to tell them she though she had an dental infection, but went to dentist yesterday and they said there is no infection after getting an xray, and told her it's probably her sinus and she most likely has a sinus infection but also told her the amoxicillin  isn't great at clearing up sinus inf and she may need a different abx.  Pt is having facial pain, nose bleed for a few days, spitting out bloody mucous, and her nose feels raw/ silghtly painful when she breaths she isn't blowing out any mucus in her nose and she's not congested but just wasn't sure if she should still take med or stop until she sees PCP on Monday. If PCP responds after 5pm, please send pt a mychart message to let her know what to do over the weekend

## 2024-09-10 NOTE — Telephone Encounter (Signed)
 Copied from CRM [id number]. Topic: Clinical - Medication Question >> Sep 10, 2024  2:40 PM [name] wrote: Reason for CRM: Patient called in requesting to know if she should continue taking the antibiotics she was prescribed by her dentist. She said they thought she had an infection but after further evaluation she does not, he recommended she may have a sinus infection and to follow up with primary. Appointment was scheduled, but still wants to know if she should stop antibiotics. Patient requesting call back at [phone].

## 2024-09-10 NOTE — Telephone Encounter (Signed)
 Don't know what antibiotic it is so cannot answer without more details  What antibiotic? What symptoms is she having ? I am out of town  Will not likely be back on computer until sat/Sunday

## 2024-09-13 ENCOUNTER — Encounter: Payer: Self-pay | Admitting: Family Medicine

## 2024-09-13 ENCOUNTER — Ambulatory Visit: Admitting: Family Medicine

## 2024-09-13 VITALS — BP 140/80 | HR 60 | Temp 98.2°F | Ht 67.0 in | Wt 132.2 lb

## 2024-09-13 DIAGNOSIS — Z130 Encounter for screening for diseases of the blood and blood-forming organs and certain disorders involving the immune mechanism: Secondary | ICD-10-CM | POA: Diagnosis not present

## 2024-09-13 DIAGNOSIS — R040 Epistaxis: Secondary | ICD-10-CM | POA: Diagnosis not present

## 2024-09-13 DIAGNOSIS — Z1151 Encounter for screening for human papillomavirus (HPV): Secondary | ICD-10-CM | POA: Diagnosis not present

## 2024-09-13 DIAGNOSIS — Z1231 Encounter for screening mammogram for malignant neoplasm of breast: Secondary | ICD-10-CM | POA: Diagnosis not present

## 2024-09-13 DIAGNOSIS — Z124 Encounter for screening for malignant neoplasm of cervix: Secondary | ICD-10-CM | POA: Diagnosis not present

## 2024-09-13 DIAGNOSIS — Z01419 Encounter for gynecological examination (general) (routine) without abnormal findings: Secondary | ICD-10-CM | POA: Diagnosis not present

## 2024-09-13 NOTE — Progress Notes (Signed)
 Subjective:    Patient ID: Alice Page, female    DOB: [birth year], 58 y.o.   MRN: [id number]  HPI  Wt Readings from Last 3 Encounters:  09/13/24 132 lb 4 oz (60 kg)  01/19/24 131 lb 4 oz (59.5 kg)  10/23/23 130 lb (59 kg)   20.71 kg/m  Vitals:   09/13/24 1225 09/13/24 1245  BP: (!) 148/82 (!) 140/80  Pulse: 60   Temp: 98.2 F (36.8 C)   SpO2: 98%     Pt presents for sinus symptoms   Was prescription amoxicillin  from dentist  Has upper right tooth- root has cracked  Thought it was dental infection -turned out not to be dental in origin   Started having some blood in mucous when she spat out mucous   Dentist did xray and examined it - fairly normal , thought this was a sinus infection   Was given amoxicillin  500 mg tid --stopped it and took some benadryl over weekend  Felt burning in her right nostril   Now feels fine  No more blood  No more pain  Puffiness under right eye is better   No fever  Never had a lot of congestion  No cough  No ST      Patient Active Problem List   Diagnosis Date Noted   Epistaxis 09/13/2024   Skin tag 01/16/2022   Lumbar back pain with radiculopathy affecting right lower extremity 04/07/2020   Disc disorder of lumbar region 04/07/2020   Elevated glucose level 01/12/2018   Colon cancer screening 12/19/2015   Routine general medical examination at a health care facility 12/07/2015   Multiple joint pain 05/16/2014   Migraine without aura, not refractory 07/31/2012   Preglaucoma 07/21/2007   Allergic rhinitis 07/21/2007   Past Medical History:  Diagnosis Date   Anemia    Heavy menses    History of allergic rhinitis    History of recurrent UTIs    Past Surgical History:  Procedure Laterality Date   LUMBAR LAMINECTOMY     Dr Mavis   Social History   Tobacco Use   Smoking status: Never   Smokeless tobacco: Never  Substance Use Topics   Alcohol use: No    Alcohol/week: 0.0 standard drinks of alcohol   Drug  use: No   Family History  Problem Relation Age of Onset   Rheum arthritis Mother    Allergies  Allergen Reactions   Cetirizine Hcl     REACTION: sedation   Ciprofloxacin     REACTION: joint pain   Tdap [Tetanus-Diphth-Acell Pertussis] Other (See Comments)    Arm pain   Current Outpatient Medications on File Prior to Visit  Medication Sig Dispense Refill   fexofenadine (ALLEGRA) 60 MG tablet Take 60 mg by mouth daily as needed for allergies or rhinitis.     No current facility-administered medications on file prior to visit.    Review of Systems  Constitutional:  Negative for activity change, appetite change, fatigue, fever and unexpected weight change.  HENT:  Positive for dental problem, nosebleeds and postnasal drip. Negative for congestion, ear pain, rhinorrhea, sinus pressure, sore throat and voice change.   Eyes:  Negative for pain, redness and visual disturbance.  Respiratory:  Negative for cough, shortness of breath and wheezing.   Cardiovascular:  Negative for chest pain and palpitations.  Gastrointestinal:  Negative for abdominal pain, blood in stool, constipation and diarrhea.  Endocrine: Negative for polydipsia and polyuria.  Genitourinary:  Negative for  dysuria, frequency and urgency.  Musculoskeletal:  Negative for arthralgias, back pain and myalgias.  Skin:  Negative for pallor and rash.  Allergic/Immunologic: Negative for environmental allergies.  Neurological:  Negative for dizziness, syncope and headaches.  Hematological:  Negative for adenopathy. Does not bruise/bleed easily.  Psychiatric/Behavioral:  Negative for decreased concentration and dysphoric mood. The patient is not nervous/anxious.        Objective:   Physical Exam Constitutional:      General: She is not in acute distress.    Appearance: Normal appearance. She is well-developed and normal weight. She is not ill-appearing or diaphoretic.  HENT:     Head: Normocephalic and atraumatic.      Comments: No facial swelling No sinus tenderness     Right Ear: Tympanic membrane and ear canal normal.     Left Ear: Ear canal normal.     Ears:     Comments: Partial cerumen obstruction     Nose: Nose normal.     Comments: Deviated septum to right  Right nostril is smaller but not swollen or congested  No lesions or sources of bleeding noted  Eyes:     Conjunctiva/sclera: Conjunctivae normal.     Pupils: Pupils are equal, round, and reactive to light.  Neck:     Thyroid : No thyromegaly.     Vascular: No carotid bruit or JVD.  Cardiovascular:     Rate and Rhythm: Normal rate and regular rhythm.     Heart sounds: Normal heart sounds.     No gallop.  Pulmonary:     Effort: Pulmonary effort is normal. No respiratory distress.     Breath sounds: Normal breath sounds. No stridor. No wheezing, rhonchi or rales.  Abdominal:     General: There is no distension or abdominal bruit.     Palpations: Abdomen is soft.  Musculoskeletal:     Cervical back: Normal range of motion and neck supple.     Right lower leg: No edema.     Left lower leg: No edema.  Lymphadenopathy:     Cervical: No cervical adenopathy.  Skin:    General: Skin is warm and dry.     Coloration: Skin is not pale.     Findings: No rash.  Neurological:     Mental Status: She is alert.     Cranial Nerves: No cranial nerve deficit.     Coordination: Coordination normal.     Deep Tendon Reflexes: Reflexes are normal and symmetric. Reflexes normal.  Psychiatric:        Mood and Affect: Mood normal.           Assessment & Plan:   Problem List Items Addressed This Visit       Other   Epistaxis - Primary   Pt experienced some blood in mucous (spitting) , without congestion , along with some right upper jaw pain  Dentist ruled out dental cause Did take a limited prescription for amoxicillin   Symptoms are better  Cannot rule out sinus infection as cause  Reassuring exam   Will continue to watch  Holding  amoxicillin  Encouraged to continue to use a humidifier (clean) for dry air  Can also try scant aquaphor to nostrils Instructed to call if symptoms return or worsen  Call back and Er precautions noted in detail today

## 2024-09-13 NOTE — Patient Instructions (Signed)
 Continue using humidifier   You can try a tiny bit of aquaphor in nostrils as needed     Watch for  Sinus pain/pressure/congestion /fever/ colored mucous  Keep us  posted   Hold on to the amoxicillin 

## 2024-09-13 NOTE — Assessment & Plan Note (Signed)
 Pt experienced some blood in mucous (spitting) , without congestion , along with some right upper jaw pain  Dentist ruled out dental cause Did take a limited prescription for amoxicillin   Symptoms are better  Cannot rule out sinus infection as cause  Reassuring exam   Will continue to watch  Holding amoxicillin  Encouraged to continue to use a humidifier (clean) for dry air  Can also try scant aquaphor to nostrils Instructed to call if symptoms return or worsen  Call back and Er precautions noted in detail today

## 2024-09-30 ENCOUNTER — Encounter: Payer: Self-pay | Admitting: Family Medicine

## 2024-10-09 ENCOUNTER — Other Ambulatory Visit: Payer: Self-pay

## 2024-10-09 ENCOUNTER — Emergency Department (HOSPITAL_COMMUNITY)
Admission: EM | Admit: 2024-10-09 | Discharge: 2024-10-09 | Disposition: A | Discharge status: Home / Self Care | Attending: Emergency Medicine | Admitting: Emergency Medicine

## 2024-10-09 DIAGNOSIS — I10 Essential (primary) hypertension: Secondary | ICD-10-CM | POA: Insufficient documentation

## 2024-10-09 LAB — COMPREHENSIVE METABOLIC PANEL WITH GFR
ALT: 19 U/L (ref 0–44)
AST: 25 U/L (ref 15–41)
Albumin: 4.6 g/dL (ref 3.5–5.0)
Alkaline Phosphatase: 49 U/L (ref 38–126)
Anion gap: 10 (ref 5–15)
BUN: 17 mg/dL (ref 6–20)
CO2: 26 mmol/L (ref 22–32)
Calcium: 10 mg/dL (ref 8.9–10.3)
Chloride: 103 mmol/L (ref 98–111)
Creatinine, Ser: 0.66 mg/dL (ref 0.44–1.00)
GFR, Estimated: >60 mL/min
Glucose, Bld: 107 mg/dL — ABNORMAL HIGH (ref 70–99)
Potassium: 4.5 mmol/L (ref 3.5–5.1)
Sodium: 139 mmol/L (ref 135–145)
Total Bilirubin: 0.5 mg/dL (ref 0.0–1.2)
Total Protein: 7.6 g/dL (ref 6.5–8.1)

## 2024-10-09 LAB — CBC WITH DIFFERENTIAL/PLATELET
Abs Immature Granulocytes: 0.04 K/uL (ref 0.00–0.07)
Basophils Absolute: 0 K/uL (ref 0.0–0.1)
Basophils Relative: 1 %
Eosinophils Absolute: 0 K/uL (ref 0.0–0.5)
Eosinophils Relative: 1 %
HCT: 40.6 % (ref 36.0–46.0)
Hemoglobin: 13.4 g/dL (ref 12.0–15.0)
Immature Granulocytes: 1 %
Lymphocytes Relative: 20 %
Lymphs Abs: 1 K/uL (ref 0.7–4.0)
MCH: 30.6 pg (ref 26.0–34.0)
MCHC: 33 g/dL (ref 30.0–36.0)
MCV: 92.7 fL (ref 80.0–100.0)
Monocytes Absolute: 0.4 K/uL (ref 0.1–1.0)
Monocytes Relative: 8 %
Neutro Abs: 3.6 K/uL (ref 1.7–7.7)
Neutrophils Relative %: 69 %
Platelets: 196 K/uL (ref 150–400)
RBC: 4.38 MIL/uL (ref 3.87–5.11)
RDW: 12.3 % (ref 11.5–15.5)
WBC: 5.2 K/uL (ref 4.0–10.5)
nRBC: 0 % (ref 0.0–0.2)

## 2024-10-09 NOTE — Discharge Instructions (Signed)
 Record your blood pressure twice a day at approximately the same time each day.  Follow-up with your doctor in a couple weeks and discuss your blood pressure with him.  Return if any problems

## 2024-10-09 NOTE — ED Notes (Signed)
 Urine sample collected and sent to lab.

## 2024-10-09 NOTE — ED Provider Notes (Signed)
" °  [facility] Provider Note   CSN: [id number] Arrival date & time: 10/09/24  1034     Patient presents with: Hypertension   Alice Page is a 58 y.o. female.  {Add pertinent medical, surgical, social history, OB history to YEP:67052} Patient states that she woke up in the middle of the night and felt anxious and her blood pressure was elevated.   Hypertension       Prior to Admission medications  Medication Sig Start Date End Date Taking? Authorizing Provider  fexofenadine (ALLEGRA) 60 MG tablet Take 60 mg by mouth daily as needed for allergies or rhinitis.    [provider]    Allergies: Cetirizine hcl, Ciprofloxacin, and Tdap [tetanus-diphth-acell pertussis]    Review of Systems  Updated Vital Signs BP (!) 154/94 (BP Location: Left Arm)   Pulse 74   Temp 98.1 F (36.7 C) (Oral)   Resp 16   Ht 5' 8 (1.727 m)   Wt 59.4 kg   LMP 08/28/2013   SpO2 99%   BMI 19.92 kg/m   Physical Exam  (all labs ordered are listed, but only abnormal results are displayed) Labs Reviewed  COMPREHENSIVE METABOLIC PANEL WITH GFR - Abnormal; Notable for the following components:      Result Value   Glucose, Bld 107 (*)    All other components within normal limits  CBC WITH DIFFERENTIAL/PLATELET    EKG: None  Radiology: No results found.  {Document cardiac monitor, telemetry assessment procedure when appropriate:32947} Procedures   Medications Ordered in the ED - No data to display    {Click here for ABCD2, HEART and other calculators REFRESH Note before signing:1}                              Medical Decision Making Amount and/or Complexity of Data Reviewed Labs: ordered. ECG/medicine tests: ordered.   ***  {Document critical care time when appropriate  Document review of labs and clinical decision tools ie CHADS2VASC2, etc  Document your independent review of radiology images and any outside  records  Document your discussion with family members, caretakers and with consultants  Document social determinants of health affecting pt's care  Document your decision making why or why not admission, treatments were needed:32947:::1}   Final diagnoses:  Primary hypertension    ED Discharge Orders     None        "

## 2024-10-09 NOTE — ED Triage Notes (Signed)
 Pt was feeling dizzy and heart racing earlier- checked BP and it was in the 200s. No hx of HTN. Pt states she is feeling better now

## 2024-10-15 ENCOUNTER — Encounter: Payer: Self-pay | Admitting: Family Medicine

## 2024-10-15 ENCOUNTER — Ambulatory Visit: Admitting: Family Medicine

## 2024-10-15 VITALS — BP 144/88 | HR 62 | Temp 98.3°F | Ht 68.0 in | Wt 132.2 lb

## 2024-10-15 DIAGNOSIS — R42 Dizziness and giddiness: Secondary | ICD-10-CM | POA: Insufficient documentation

## 2024-10-15 DIAGNOSIS — R030 Elevated blood-pressure reading, without diagnosis of hypertension: Secondary | ICD-10-CM

## 2024-10-15 NOTE — Assessment & Plan Note (Addendum)
 Blood pressure at home is labile but often over 130/80 Seen in ER on 12/20 Today BP: (!) 144/88   Reviewed hospital records, lab results and studies in detail   Reassuring work up Father has HTN   Long discussion re: possible essential HTN Feels light headed at times -? If this is due to above  Declines medication-wants to work on lifestyle change Handouts given See AVS Does not drink etoh  One cup coffee daily  Not a lot of processed foods Needs exercise plan   Instructed to update 1-2 wk with blood pressure readings   If agreeable to med would start with arb or amlodipine  May avoid diuretic in light of dehydration risk   I personally spent a total of 31 minutes in the care of the patient today including preparing to see the patient, getting/reviewing separately obtained history, performing a medically appropriate exam/evaluation, counseling and educating, and documenting clinical information in the EHR.

## 2024-10-15 NOTE — Patient Instructions (Addendum)
 Minimize caffeine  Avoid alcohol   Eat less processed foods   Try to get most of your carbohydrates from produce (with the exception of white potatoes) and whole grains Eat less bread/pasta/rice/snack foods/cereals/sweets and other items from the middle of the grocery store (processed carbs)  Keep up good fluid intake - 48 oz daily water    Aim for exercise -work up to at least 30 minutes 5 days per week  Some cardio- get heart rate up  Some strength building exercise  Add some strength training to your routine, this is important for bone and brain health and can reduce your risk of falls and help your body use insulin properly and regulate weight  Light weights, exercise bands , and internet videos are a good way to start  Yoga (chair or regular), machines , floor exercises or a gym with machines are also good options    Reach out to us  in 1-2 weeks with how blood pressure readings are at home and how you feel

## 2024-10-15 NOTE — Progress Notes (Signed)
 "  Subjective:    Patient ID: Alice Page, female    DOB: August 17, 1966, 58 y.o.   MRN: [id number]  HPI  Wt Readings from Last 3 Encounters:  10/15/24 132 lb 4 oz (60 kg)  10/09/24 131 lb (59.4 kg)  09/13/24 132 lb 4 oz (60 kg)   20.11 kg/m  Vitals:   10/15/24 0959 10/15/24 1017  BP: (!) 142/86 (!) 144/88  Pulse: 62   Temp: 98.3 F (36.8 C)   SpO2: 98%      Pt presents with  Blood pressure concern   Seen in ER on 12/20 Woke up in the night- felt anxious and blood pressure was elevated  Feels off  Balance is not right (but not spinning)  A little light headed    Blood pressure in ER 154/94   EKG-reassuring   Lab Results  Component Value Date   NA 139 10/09/2024   K 4.5 10/09/2024   CO2 26 10/09/2024   GLUCOSE 107 (H) 10/09/2024   BUN 17 10/09/2024   CREATININE 0.66 10/09/2024   CALCIUM 10.0 10/09/2024   GFR 95.21 01/12/2024   GFRNONAA >60 10/09/2024   Lab Results  Component Value Date   ALT 19 10/09/2024   AST 25 10/09/2024   ALKPHOS 49 10/09/2024   BILITOT 0.5 10/09/2024   Lab Results  Component Value Date   WBC 5.2 10/09/2024   HGB 13.4 10/09/2024   HCT 40.6 10/09/2024   MCV 92.7 10/09/2024   PLT 196 10/09/2024   Lab Results  Component Value Date   HGBA1C 5.6 01/12/2024   HGBA1C 5.5 01/17/2021   HGBA1C 5.6 04/07/2019      BP Readings from Last 3 Encounters:  10/15/24 (!) 144/88  10/09/24 (!) 145/97  09/13/24 (!) 140/80   Pulse Readings from Last 3 Encounters:  10/15/24 62  10/09/24 61  09/13/24 60    Still feels funny ? Light headed/ balance is off   Was recently on macrobid  for uti - stopped within a week    Blood pressure home  170/97-pre ER visit (higher when EMS came)  Even after lying down   Now running 130s-150s systolic  One day 116/77  Diastolic 70s and 19d   No etoh at all  Has to be mindful about fluids- she does not get very thirsty   Father had HTN       10/15/2024   10:07 AM 09/13/2024    12:35 PM 01/19/2024    3:59 PM 10/23/2023   12:20 PM 01/23/2021    3:40 PM  Depression screen PHQ 2/9  Decreased Interest 0 0 0 0 0  Down, Depressed, Hopeless 0 0 0 0 0  PHQ - 2 Score 0 0 0 0 0  Altered sleeping 0 0 0  0  Tired, decreased energy 0 0 0  0  Change in appetite 0 0 0  0  Feeling bad or failure about yourself  0 0 0  0  Trouble concentrating 0 0 0  0  Moving slowly or fidgety/restless 0 0 0  0  Suicidal thoughts 0 0 0  0  PHQ-9 Score 0 0 0   0   Difficult doing work/chores Not difficult at all Not difficult at all Not difficult at all  Not difficult at all     Data saved with a previous flowsheet row definition      10/15/2024   10:07 AM 09/13/2024   12:35 PM 01/19/2024    4:00 PM  GAD 7 : Generalized Anxiety Score  Nervous, Anxious, on Edge 0 0 0  Control/stop worrying 0 0 0  Worry too much - different things 0 0 0  Trouble relaxing 0 0 0  Restless 0 0 0  Easily annoyed or irritable 0 0 0  Afraid - awful might happen 0 0 0  Total GAD 7 Score 0 0 0  Anxiety Difficulty Not difficult at all Not difficult at all Not difficult at all        Patient Active Problem List   Diagnosis Date Noted   Light headed 10/15/2024   Epistaxis 09/13/2024   Skin tag 01/16/2022   Lumbar back pain with radiculopathy affecting right lower extremity 04/07/2020   Disc disorder of lumbar region 04/07/2020   Elevated glucose level 01/12/2018   Colon cancer screening 12/19/2015   Routine general medical examination at a health care facility 12/07/2015   Elevated blood pressure reading 05/31/2015   Multiple joint pain 05/16/2014   Migraine without aura, not refractory 07/31/2012   Preglaucoma 07/21/2007   Allergic rhinitis 07/21/2007   Past Medical History:  Diagnosis Date   Anemia    Heavy menses    History of allergic rhinitis    History of recurrent UTIs    Past Surgical History:  Procedure Laterality Date   LUMBAR LAMINECTOMY     Dr Mavis   Social History[1] Family  History  Problem Relation Age of Onset   Rheum arthritis Mother    Allergies[2] Medications Ordered Prior to Encounter[3]  Review of Systems  Constitutional:  Positive for fatigue. Negative for activity change, appetite change, fever and unexpected weight change.  HENT:  Negative for congestion, ear pain, rhinorrhea, sinus pressure and sore throat.   Eyes:  Negative for pain, redness and visual disturbance.  Respiratory:  Negative for cough, shortness of breath and wheezing.   Cardiovascular:  Negative for chest pain and palpitations.  Gastrointestinal:  Negative for abdominal pain, blood in stool, constipation and diarrhea.  Endocrine: Negative for polydipsia and polyuria.  Genitourinary:  Negative for dysuria, frequency and urgency.  Musculoskeletal:  Negative for arthralgias, back pain and myalgias.  Skin:  Negative for pallor and rash.  Allergic/Immunologic: Negative for environmental allergies.  Neurological:  Positive for light-headedness. Negative for dizziness, tremors, seizures, syncope, facial asymmetry, speech difficulty, weakness, numbness and headaches.       Feels off balance  Hematological:  Negative for adenopathy. Does not bruise/bleed easily.  Psychiatric/Behavioral:  Negative for decreased concentration and dysphoric mood. The patient is not nervous/anxious.        Some stressors recently       Objective:   Physical Exam Constitutional:      General: She is not in acute distress.    Appearance: Normal appearance. She is well-developed and normal weight. She is not ill-appearing or diaphoretic.  HENT:     Head: Normocephalic and atraumatic.  Eyes:     Conjunctiva/sclera: Conjunctivae normal.     Pupils: Pupils are equal, round, and reactive to light.  Neck:     Thyroid : No thyromegaly.     Vascular: No carotid bruit or JVD.  Cardiovascular:     Rate and Rhythm: Normal rate and regular rhythm.     Heart sounds: Normal heart sounds.     No gallop.   Pulmonary:     Effort: Pulmonary effort is normal. No respiratory distress.     Breath sounds: Normal breath sounds. No wheezing or rales.  Abdominal:  General: There is no distension or abdominal bruit.     Palpations: Abdomen is soft.  Musculoskeletal:     Cervical back: Normal range of motion and neck supple.     Right lower leg: No edema.     Left lower leg: No edema.  Lymphadenopathy:     Cervical: No cervical adenopathy.  Skin:    General: Skin is warm and dry.     Coloration: Skin is not pale.     Findings: No rash.  Neurological:     Mental Status: She is alert.     Cranial Nerves: No cranial nerve deficit or facial asymmetry.     Sensory: No sensory deficit.     Motor: No weakness, tremor, atrophy, abnormal muscle tone, seizure activity or pronator drift.     Coordination: Romberg sign negative. Coordination normal. Finger-Nose-Finger Test normal.     Gait: Gait normal.     Deep Tendon Reflexes: Reflexes are normal and symmetric. Reflexes normal.  Psychiatric:        Mood and Affect: Mood normal.           Assessment & Plan:   Problem List Items Addressed This Visit       Other   Light headed   Intermittent light headed feeling  Started when on macrobid  for uti -off now less than a week  Normal exam  Elevated blood pressure- may need treatment for this  Normal neuro exam today  Encouraged hydration  Report back         Elevated blood pressure reading - Primary   Blood pressure at home is labile but often over 130/80 Seen in ER on 12/20 Today BP: (!) 144/88   Reviewed hospital records, lab results and studies in detail   Reassuring work up Father has HTN   Long discussion re: possible essential HTN Feels light headed at times -? If this is due to above  Declines medication-wants to work on lifestyle change Handouts given See AVS Does not drink etoh  One cup coffee daily  Not a lot of processed foods Needs exercise plan   Instructed  to update 1-2 wk with blood pressure readings   If agreeable to med would start with arb or amlodipine  May avoid diuretic in light of dehydration risk   I personally spent a total of 31 minutes in the care of the patient today including preparing to see the patient, getting/reviewing separately obtained history, performing a medically appropriate exam/evaluation, counseling and educating, and documenting clinical information in the EHR.          [1]  Social History Tobacco Use   Smoking status: Never   Smokeless tobacco: Never  Substance Use Topics   Alcohol use: No    Alcohol/week: 0.0 standard drinks of alcohol   Drug use: No  [2]  Allergies Allergen Reactions   Cetirizine Hcl     REACTION: sedation   Ciprofloxacin     REACTION: joint pain   Tdap [Tetanus-Diphth-Acell Pertussis] Other (See Comments)    Arm pain  [3]  Current Outpatient Medications on File Prior to Visit  Medication Sig Dispense Refill   fexofenadine (ALLEGRA) 60 MG tablet Take 60 mg by mouth daily as needed for allergies or rhinitis.     No current facility-administered medications on file prior to visit.   "

## 2024-10-15 NOTE — Assessment & Plan Note (Signed)
 Intermittent light headed feeling  Started when on macrobid  for uti -off now less than a week  Normal exam  Elevated blood pressure- may need treatment for this  Normal neuro exam today  Encouraged hydration  Report back

## 2024-10-18 ENCOUNTER — Ambulatory Visit: Admitting: Family Medicine
